# Patient Record
Sex: Male | Born: 1935 | Race: White | Hispanic: No | Smoking: Never smoker
Health system: Southern US, Community
[De-identification: ages and names within clinical notes are randomized; demographics above are authoritative.]

## PROBLEM LIST (undated history)

## (undated) DIAGNOSIS — IMO0001 Reserved for inherently not codable concepts without codable children: Secondary | ICD-10-CM

## (undated) DIAGNOSIS — L03115 Cellulitis of right lower limb: Secondary | ICD-10-CM

## (undated) DIAGNOSIS — E1142 Type 2 diabetes mellitus with diabetic polyneuropathy: Secondary | ICD-10-CM

## (undated) DIAGNOSIS — I2510 Atherosclerotic heart disease of native coronary artery without angina pectoris: Secondary | ICD-10-CM

## (undated) DIAGNOSIS — E785 Hyperlipidemia, unspecified: Secondary | ICD-10-CM

## (undated) DIAGNOSIS — L97519 Non-pressure chronic ulcer of other part of right foot with unspecified severity: Secondary | ICD-10-CM

## (undated) DIAGNOSIS — Z5189 Encounter for other specified aftercare: Secondary | ICD-10-CM

## (undated) DIAGNOSIS — I10 Essential (primary) hypertension: Secondary | ICD-10-CM

## (undated) DIAGNOSIS — Z950 Presence of cardiac pacemaker: Secondary | ICD-10-CM

## (undated) DIAGNOSIS — R296 Repeated falls: Secondary | ICD-10-CM

## (undated) DIAGNOSIS — H5440 Blindness, one eye, unspecified eye: Secondary | ICD-10-CM

## (undated) DIAGNOSIS — E119 Type 2 diabetes mellitus without complications: Secondary | ICD-10-CM

## (undated) DIAGNOSIS — I209 Angina pectoris, unspecified: Secondary | ICD-10-CM

## (undated) HISTORY — PX: CARDIAC CATHETERIZATION: SHX172

## (undated) HISTORY — PX: TOTAL KNEE ARTHROPLASTY: SHX125

## (undated) HISTORY — PX: CATARACT EXTRACTION W/ INTRAOCULAR LENS  IMPLANT, BILATERAL: SHX1307

## (undated) HISTORY — DX: Hyperlipidemia, unspecified: E78.5

## (undated) HISTORY — DX: Atherosclerotic heart disease of native coronary artery without angina pectoris: I25.10

## (undated) HISTORY — PX: TONSILLECTOMY: SUR1361

## (undated) HISTORY — DX: Essential (primary) hypertension: I10

## (undated) HISTORY — PX: EYE SURGERY: SHX253

## (undated) HISTORY — PX: APPENDECTOMY: SHX54

---

## 1990-03-14 DIAGNOSIS — I209 Angina pectoris, unspecified: Secondary | ICD-10-CM

## 1990-03-14 DIAGNOSIS — Z950 Presence of cardiac pacemaker: Secondary | ICD-10-CM

## 1990-03-14 HISTORY — DX: Angina pectoris, unspecified: I20.9

## 1990-03-14 HISTORY — PX: CORONARY ARTERY BYPASS GRAFT: SHX141

## 1990-03-14 HISTORY — DX: Presence of cardiac pacemaker: Z95.0

## 1997-03-14 HISTORY — PX: INSERT / REPLACE / REMOVE PACEMAKER: SUR710

## 1997-07-19 ENCOUNTER — Inpatient Hospital Stay (HOSPITAL_COMMUNITY): Admission: EM | Admit: 1997-07-19 | Discharge: 1997-07-22 | Payer: Self-pay | Admitting: Emergency Medicine

## 1997-10-01 ENCOUNTER — Inpatient Hospital Stay (HOSPITAL_COMMUNITY): Admission: RE | Admit: 1997-10-01 | Discharge: 1997-10-06 | Payer: Self-pay | Admitting: Specialist

## 1997-10-08 ENCOUNTER — Other Ambulatory Visit: Admission: RE | Admit: 1997-10-08 | Discharge: 1997-10-08 | Payer: Self-pay | Admitting: Specialist

## 1997-10-10 ENCOUNTER — Other Ambulatory Visit: Admission: RE | Admit: 1997-10-10 | Discharge: 1997-10-10 | Payer: Self-pay | Admitting: Specialist

## 1999-11-23 ENCOUNTER — Encounter: Admission: RE | Admit: 1999-11-23 | Discharge: 1999-11-23 | Payer: Self-pay | Admitting: Geriatric Medicine

## 1999-11-23 ENCOUNTER — Encounter: Payer: Self-pay | Admitting: Geriatric Medicine

## 1999-11-23 ENCOUNTER — Encounter: Payer: Self-pay | Admitting: Neurological Surgery

## 1999-11-23 ENCOUNTER — Inpatient Hospital Stay (HOSPITAL_COMMUNITY): Admission: RE | Admit: 1999-11-23 | Discharge: 1999-12-01 | Payer: Self-pay | Admitting: Neurological Surgery

## 1999-11-23 ENCOUNTER — Encounter (INDEPENDENT_AMBULATORY_CARE_PROVIDER_SITE_OTHER): Payer: Self-pay | Admitting: Specialist

## 1999-11-25 ENCOUNTER — Encounter: Payer: Self-pay | Admitting: Neurological Surgery

## 1999-11-26 ENCOUNTER — Encounter: Payer: Self-pay | Admitting: Neurological Surgery

## 1999-11-29 ENCOUNTER — Encounter: Payer: Self-pay | Admitting: Neurological Surgery

## 1999-12-01 ENCOUNTER — Inpatient Hospital Stay (HOSPITAL_COMMUNITY)
Admission: RE | Admit: 1999-12-01 | Discharge: 1999-12-17 | Payer: Self-pay | Admitting: Physical Medicine & Rehabilitation

## 1999-12-06 ENCOUNTER — Encounter: Payer: Self-pay | Admitting: Physical Medicine & Rehabilitation

## 1999-12-20 ENCOUNTER — Encounter
Admission: RE | Admit: 1999-12-20 | Discharge: 2000-03-19 | Payer: Self-pay | Admitting: Physical Medicine & Rehabilitation

## 2000-01-11 ENCOUNTER — Encounter: Payer: Self-pay | Admitting: Neurological Surgery

## 2000-01-11 ENCOUNTER — Encounter: Admission: RE | Admit: 2000-01-11 | Discharge: 2000-01-11 | Payer: Self-pay | Admitting: Neurological Surgery

## 2000-09-05 ENCOUNTER — Ambulatory Visit (HOSPITAL_COMMUNITY): Admission: RE | Admit: 2000-09-05 | Discharge: 2000-09-05 | Payer: Self-pay | Admitting: Critical Care Medicine

## 2000-09-05 ENCOUNTER — Encounter: Payer: Self-pay | Admitting: Critical Care Medicine

## 2002-08-15 ENCOUNTER — Emergency Department (HOSPITAL_COMMUNITY): Admission: EM | Admit: 2002-08-15 | Discharge: 2002-08-15 | Payer: Self-pay | Admitting: Emergency Medicine

## 2002-08-15 ENCOUNTER — Encounter: Payer: Self-pay | Admitting: Emergency Medicine

## 2003-09-09 ENCOUNTER — Inpatient Hospital Stay (HOSPITAL_COMMUNITY): Admission: AD | Admit: 2003-09-09 | Discharge: 2003-09-11 | Payer: Self-pay | Admitting: Cardiovascular Disease

## 2003-09-12 ENCOUNTER — Ambulatory Visit (HOSPITAL_COMMUNITY): Admission: RE | Admit: 2003-09-12 | Discharge: 2003-09-13 | Payer: Self-pay | Admitting: Ophthalmology

## 2006-01-03 ENCOUNTER — Encounter: Payer: Self-pay | Admitting: Internal Medicine

## 2006-03-11 ENCOUNTER — Emergency Department (HOSPITAL_COMMUNITY): Admission: EM | Admit: 2006-03-11 | Discharge: 2006-03-11 | Payer: Self-pay | Admitting: Emergency Medicine

## 2006-06-13 ENCOUNTER — Encounter: Payer: Self-pay | Admitting: Internal Medicine

## 2006-12-21 ENCOUNTER — Encounter: Payer: Self-pay | Admitting: Internal Medicine

## 2007-06-25 ENCOUNTER — Encounter: Payer: Self-pay | Admitting: Internal Medicine

## 2007-08-22 ENCOUNTER — Encounter: Admission: RE | Admit: 2007-08-22 | Discharge: 2007-08-22 | Payer: Self-pay | Admitting: Geriatric Medicine

## 2007-12-31 ENCOUNTER — Encounter: Payer: Self-pay | Admitting: Internal Medicine

## 2008-05-29 ENCOUNTER — Encounter: Payer: Self-pay | Admitting: Internal Medicine

## 2008-12-16 ENCOUNTER — Encounter: Payer: Self-pay | Admitting: Internal Medicine

## 2009-03-05 ENCOUNTER — Observation Stay (HOSPITAL_COMMUNITY): Admission: EM | Admit: 2009-03-05 | Discharge: 2009-03-06 | Payer: Self-pay | Admitting: Cardiovascular Disease

## 2009-03-27 ENCOUNTER — Ambulatory Visit: Payer: Self-pay | Admitting: Vascular Surgery

## 2009-05-25 ENCOUNTER — Encounter: Payer: Self-pay | Admitting: Internal Medicine

## 2009-06-30 ENCOUNTER — Encounter (HOSPITAL_BASED_OUTPATIENT_CLINIC_OR_DEPARTMENT_OTHER): Admission: RE | Admit: 2009-06-30 | Discharge: 2009-09-28 | Payer: Self-pay | Admitting: General Surgery

## 2009-07-06 ENCOUNTER — Ambulatory Visit (HOSPITAL_COMMUNITY): Admission: RE | Admit: 2009-07-06 | Discharge: 2009-07-06 | Payer: Self-pay | Admitting: General Surgery

## 2009-07-08 ENCOUNTER — Ambulatory Visit: Payer: Self-pay | Admitting: Cardiovascular Disease

## 2009-07-08 ENCOUNTER — Encounter (HOSPITAL_BASED_OUTPATIENT_CLINIC_OR_DEPARTMENT_OTHER): Payer: Self-pay | Admitting: General Surgery

## 2009-07-08 ENCOUNTER — Ambulatory Visit (HOSPITAL_COMMUNITY): Admission: RE | Admit: 2009-07-08 | Discharge: 2009-07-08 | Payer: Self-pay | Admitting: General Surgery

## 2009-07-08 HISTORY — PX: US ECHOCARDIOGRAPHY: HXRAD669

## 2009-07-16 ENCOUNTER — Ambulatory Visit: Payer: Self-pay | Admitting: Vascular Surgery

## 2009-08-19 ENCOUNTER — Ambulatory Visit (HOSPITAL_COMMUNITY): Admission: RE | Admit: 2009-08-19 | Discharge: 2009-08-19 | Payer: Self-pay | Admitting: General Surgery

## 2009-08-20 ENCOUNTER — Telehealth (INDEPENDENT_AMBULATORY_CARE_PROVIDER_SITE_OTHER): Payer: Self-pay | Admitting: *Deleted

## 2009-08-24 ENCOUNTER — Ambulatory Visit: Payer: Self-pay | Admitting: Cardiovascular Disease

## 2009-08-24 ENCOUNTER — Encounter (HOSPITAL_COMMUNITY): Admission: RE | Admit: 2009-08-24 | Discharge: 2009-10-13 | Payer: Self-pay | Admitting: General Surgery

## 2009-08-24 ENCOUNTER — Ambulatory Visit: Payer: Self-pay

## 2009-09-10 ENCOUNTER — Telehealth (INDEPENDENT_AMBULATORY_CARE_PROVIDER_SITE_OTHER): Payer: Self-pay | Admitting: *Deleted

## 2009-09-29 ENCOUNTER — Encounter (HOSPITAL_BASED_OUTPATIENT_CLINIC_OR_DEPARTMENT_OTHER): Admission: RE | Admit: 2009-09-29 | Discharge: 2009-12-11 | Payer: Self-pay | Admitting: General Surgery

## 2009-10-09 ENCOUNTER — Ambulatory Visit: Payer: Self-pay | Admitting: Vascular Surgery

## 2009-11-06 ENCOUNTER — Ambulatory Visit (HOSPITAL_COMMUNITY): Admission: RE | Admit: 2009-11-06 | Discharge: 2009-11-06 | Payer: Self-pay | Admitting: General Surgery

## 2009-12-17 ENCOUNTER — Encounter (HOSPITAL_BASED_OUTPATIENT_CLINIC_OR_DEPARTMENT_OTHER)
Admission: RE | Admit: 2009-12-17 | Discharge: 2010-02-12 | Payer: Self-pay | Source: Home / Self Care | Admitting: General Surgery

## 2010-02-22 ENCOUNTER — Encounter: Payer: Self-pay | Admitting: Internal Medicine

## 2010-02-22 ENCOUNTER — Ambulatory Visit: Payer: Self-pay | Admitting: Internal Medicine

## 2010-02-22 DIAGNOSIS — N183 Chronic kidney disease, stage 3 (moderate): Secondary | ICD-10-CM

## 2010-02-22 DIAGNOSIS — Z950 Presence of cardiac pacemaker: Secondary | ICD-10-CM | POA: Insufficient documentation

## 2010-02-22 DIAGNOSIS — E1129 Type 2 diabetes mellitus with other diabetic kidney complication: Secondary | ICD-10-CM | POA: Insufficient documentation

## 2010-02-22 DIAGNOSIS — E1159 Type 2 diabetes mellitus with other circulatory complications: Secondary | ICD-10-CM | POA: Insufficient documentation

## 2010-02-22 DIAGNOSIS — I798 Other disorders of arteries, arterioles and capillaries in diseases classified elsewhere: Secondary | ICD-10-CM

## 2010-02-22 DIAGNOSIS — E1165 Type 2 diabetes mellitus with hyperglycemia: Secondary | ICD-10-CM

## 2010-02-22 DIAGNOSIS — E1142 Type 2 diabetes mellitus with diabetic polyneuropathy: Secondary | ICD-10-CM | POA: Insufficient documentation

## 2010-02-22 DIAGNOSIS — I10 Essential (primary) hypertension: Secondary | ICD-10-CM

## 2010-02-22 DIAGNOSIS — E119 Type 2 diabetes mellitus without complications: Secondary | ICD-10-CM | POA: Insufficient documentation

## 2010-02-22 DIAGNOSIS — E785 Hyperlipidemia, unspecified: Secondary | ICD-10-CM | POA: Insufficient documentation

## 2010-02-22 DIAGNOSIS — I2510 Atherosclerotic heart disease of native coronary artery without angina pectoris: Secondary | ICD-10-CM | POA: Insufficient documentation

## 2010-03-05 ENCOUNTER — Encounter (HOSPITAL_BASED_OUTPATIENT_CLINIC_OR_DEPARTMENT_OTHER)
Admission: RE | Admit: 2010-03-05 | Discharge: 2010-03-30 | Payer: Self-pay | Source: Home / Self Care | Attending: General Surgery | Admitting: General Surgery

## 2010-04-04 ENCOUNTER — Encounter (HOSPITAL_BASED_OUTPATIENT_CLINIC_OR_DEPARTMENT_OTHER): Payer: Self-pay | Admitting: General Surgery

## 2010-04-13 NOTE — Progress Notes (Signed)
Summary: MUGA Pre-Procedure  Phone Note Outgoing Call Call back at [facility] Phone [phone]   Call placed by: Stanton Kidney, EMT-P,  August 20, 2009 10:36 AM Call placed to: Patient Action Taken: Phone Call Completed Summary of Call: Reviewed information on MUGA Information Sheet (see scanned document for further details).  Spoke with Patient.    Nuclear Med Background Indications for Stress Test: Evaluation for Ischemia  Indications Comments: Pre-Hyperbaric workup  History: Echo, Pacemaker  History Comments: 07/08/09 Echo: poor quality, mild AR, MR     Nuclear Pre-Procedure Cardiac Risk Factors: Carotid Disease, IDDM Type 2, Lipids

## 2010-04-13 NOTE — Progress Notes (Signed)
  Records Recieved from [facility], gave to [facility]  September 10, 2009 2:35 PM

## 2010-04-15 NOTE — Letter (Signed)
Summary: [facility] Office Visit   [facility] Office Visit   Imported By: Roderic Ovens 03/04/2010 10:56:31  _____________________________________________________________________  External Attachment:    Type:   Image     Comment:   External Document

## 2010-04-15 NOTE — Letter (Signed)
Summary: [facility] Office Visit   [facility] Office Visit   Imported By: Roderic Ovens 03/04/2010 10:55:37  _____________________________________________________________________  External Attachment:    Type:   Image     Comment:   External Document

## 2010-04-15 NOTE — Assessment & Plan Note (Signed)
Summary: nep / previous pt of dr. Aleen Campi. medicare. gd   Visit Type:  np  CC:  shortness of breath and .  History of Present Illness: [name] [name] is referred by Dr. Aleen Campi for ongoing evaluation and treatment of bradycardia s/p PPM, HTN, and CAD.  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  He has done amazingly well.  He still remains active and denies c/p, sob, or peripheral edema. His hearing is decreased.  No other complaints. No weight loss.  Problems Prior to Update: 1)  Peripheral Angiopathy Diseases Classified [place]  (ICD-443.81) 2)  Diab W/periph Circ D/o Type Ii/uns Type Uncntrl  (ICD-250.72) 3)  Diab W/renal Manifests Type Ii/uns Type Uncntrl  (ICD-250.42) 4)  Renal Disease, Chronic, Stage Iii  (ICD-585.3) 5)  Essential Hypertension, Benign  (ICD-401.1) 6)  Diabetes Mellitus, Type II, Controlled  (ICD-250.00) 7)  Peripheral Angiopathy Diseases Classified [place]  (ICD-443.81) 8)  Polyneuropathy in Diabetes  (ICD-357.2)  Current Problems (verified): 1)  Peripheral Angiopathy Diseases Classified [place]  (ICD-443.81) 2)  Diab W/periph Circ D/o Type Ii/uns Type Uncntrl  (ICD-250.72) 3)  Diab W/renal Manifests Type Ii/uns Type Uncntrl  (ICD-250.42) 4)  Renal Disease, Chronic, Stage Iii  (ICD-585.3) 5)  Essential Hypertension, Benign  (ICD-401.1) 6)  Diabetes Mellitus, Type II, Controlled  (ICD-250.00) 7)  Peripheral Angiopathy Diseases Classified [place]  (ICD-443.81) 8)  Polyneuropathy in Diabetes  (ICD-357.2)  Medications Prior to Update: 1)  None  Current Medications (verified): 1)  Xalatan 0.005 % Soln (Latanoprost) .Marland Kitchen.. 1 Drop Each Eye Every Evening 2)  Freestyle Lite Test  Strp (Glucose Blood) .... Twice Daily As Directed 3)  Humulin N 100 Unit/ml Susp (Insulin Isophane Human) .... 65 Units Every Morning 4)  Actos 15 Mg Tabs (Pioglitazone Hcl) .... T Tablet As Needed Elevated Glucose 5)  Prinivil 5 Mg Tabs (Lisinopril) .... Once Daily 6)  Metoprolol  Tartrate 25 Mg Tabs (Metoprolol Tartrate) .... Once Daily 7)  Miralax  Pack (Polyethylene Glycol 3350) .Marland Kitchen.. 1 Packet Mixed With 8 Oz of Fluid Orally Once Daily 8)  Aspirin 325 Mg Tabs (Aspirin) .... Once Daily 9)  Dorzolamide Hcl 2 % Soln (Dorzolamide Hcl) .Marland Kitchen.. 1 Gtt Into Each Eye Two Times A Day 10)  Lipitor 40 Mg Tabs (Atorvastatin Calcium) .... Once Daily  Allergies (verified): 1)  ! Aleve (Naproxen Sodium)  Past History:  Past Medical History: Hypertension  Family History: both parents deceased no alcohol or tobacco  Social History: widowed  Review of Systems       All systems reviewed and negative except as noted in the HPI.  Vital Signs:  Patient profile:   77 year old male Height:      70 inches Weight:      205.50 pounds BMI:     29.59 Pulse rate:   77 / minute BP sitting:   144 / 70  (left arm) Cuff size:   large  Vitals Entered By: Caralee Ates CMA (February 22, 2010 1:59 PM)  Physical Exam  General:  Elderly, well developed, well nourished, in no acute distress.  HEENT: normal Neck: supple. No JVD. Carotids 2+ bilaterally no bruits Cor: RRR no rubs, gallops or murmur Lungs: CTA. Well healed PPM. Ab: soft, nontender. nondistended. No HSM. Good bowel sounds Ext: warm. no cyanosis, clubbing or edema Neuro: alert and oriented. Grossly nonfocal. affect pleasant    PPM Specifications Following MD:  Lewayne Bunting, MD     Referring MD:  Melvenia Beam  PPM Vendor:  [employer]     PPM Model Number:  [id number]     PPM Serial Number:  [id number] PPM DOI:  09/09/2003     PPM Implanting MD:  Melvenia Beam  Lead 1    Location: RA     DOI: 07/21/1997     Model #: [id number]     Serial #: [id number]     Status: active Lead 2    Location: RV     DOI: 07/21/1997     Model #: [id number]     Serial #: [id number]     Status: active  Magnet Response Rate:  BOL 98.6 ERI 86.3  Indications:  Huston Foley   PPM Follow Up Remote Check?  No Battery Voltage:  2.76 V     Battery Est. Longevity:  2.25  years     Pacer Dependent:  No       PPM Device Measurements Atrium  Amplitude: 1.8 mV, Impedance: <200 ohms, Threshold: 1.25 V at 0.5 msec Right Ventricle  Amplitude: 3.0 mV, Impedance: 441 ohms, Threshold: 0.5 V at 0.5 msec  Episodes MS Episodes:  490     Percent Mode Switch:  <1%     Coumadin:  No Atrial Pacing:  88%     Ventricular Pacing:  63%  Parameters Mode:  DDDR     Lower Rate Limit:  75     Upper Rate Limit:  110 Paced AV Delay:  200     Sensed AV Delay:  170 Next Cardiology Appt Due:  08/13/2010 Tech Comments:  Atrial impedance < 200 Uni and bipolar.  R-waves 3.0 bipolar.  RA output increased 2.5@0 .5 bipolar.  Mr. Potts does TTM's but he's not sure with what company.  ROV 6 months clinic. Altha Harm, LPN  February 22, 2010 2:35 PM  MD Comments:  Agree with above.  Impression & Recommendations:  Problem # 1:  CARDIAC PACEMAKER IN SITU (ICD-V45.01) His device is working normally.  Will recheck in several months.  Problem # 2:  CAD (ICD-414.00) He denies anginal symptoms. continue current meds. His updated medication list for this problem includes:    Prinivil 5 Mg Tabs (Lisinopril) ..... Once daily    Metoprolol Tartrate 25 Mg Tabs (Metoprolol tartrate) ..... Once daily    Aspirin 325 Mg Tabs (Aspirin) ..... Once daily  Problem # 3:  DYSLIPIDEMIA (ICD-272.4) He will maintain a low fat diet and lipitor. His updated medication list for this problem includes:    Lipitor 40 Mg Tabs (Atorvastatin calcium) ..... Once daily  Patient Instructions: 1)  Your physician recommends that you continue on your current medications as directed. Please refer to the Current Medication list given to you today. 2)  Your physician wants you to follow-up in: 6 months with [facility] and 1 year with Dr. Ladona Ridgel.   You will receive a reminder letter in the mail two months in advance. If you don't receive a letter, please call our office to schedule the follow-up appointment.  Prevention &  Chronic Care Immunizations   Influenza vaccine: Not documented    Tetanus booster: Not documented    Pneumococcal vaccine: Not documented    H. zoster vaccine: Not documented  Colorectal Screening   Hemoccult: Not documented    Colonoscopy: Not documented  Other Screening   PSA: Not documented   Smoking status: Not documented  Diabetes Mellitus   HgbA1C: Not documented    Eye exam: Not documented    Foot exam: Not documented  High risk foot: Not documented   Foot care education: Not documented    Urine microalbumin/creatinine ratio: Not documented  Lipids   Total Cholesterol: Not documented   LDL: Not documented   LDL Direct: Not documented   HDL: Not documented   Triglycerides: Not documented    SGOT (AST): Not documented   SGPT (ALT): Not documented   Alkaline phosphatase: Not documented   Total bilirubin: Not documented  Hypertension   Last Blood Pressure: 144 / 70  (02/22/2010)   Serum creatinine: Not documented   Serum potassium Not documented  Self-Management Support :    Diabetes self-management support: Not documented    Hypertension self-management support: Not documented    Lipid self-management support: Not documented

## 2010-04-15 NOTE — Letter (Signed)
Summary: [facility] [place], Discharge Summary, Echo, Op Report   [facility] [place], Discharge Summary, Echo, Op Report 2005-2007   Imported By: Roderic Ovens 03/04/2010 10:59:17  _____________________________________________________________________  External Attachment:    Type:   Image     Comment:   External Document

## 2010-04-15 NOTE — Letter (Signed)
Summary: [facility] Office Visit   [facility] Office Visit   Imported By: Roderic Ovens 03/04/2010 10:57:13  _____________________________________________________________________  External Attachment:    Type:   Image     Comment:   External Document

## 2010-04-15 NOTE — Letter (Signed)
Summary: [facility] Office Visit   [facility] Office Visit   Imported By: Roderic Ovens 03/04/2010 10:55:11  _____________________________________________________________________  External Attachment:    Type:   Image     Comment:   External Document

## 2010-04-15 NOTE — Letter (Signed)
Summary: [facility] Office Visit   [facility] Office Visit   Imported By: Roderic Ovens 03/04/2010 10:55:55  _____________________________________________________________________  External Attachment:    Type:   Image     Comment:   External Document

## 2010-04-15 NOTE — Letter (Signed)
Summary: [facility] Office Visit   [facility] Office Visit   Imported By: Roderic Ovens 03/04/2010 10:56:49  _____________________________________________________________________  External Attachment:    Type:   Image     Comment:   External Document

## 2010-04-15 NOTE — Letter (Signed)
Summary: External Correspondence  External Correspondence   Imported By: Roderic Ovens 03/04/2010 10:56:16  _____________________________________________________________________  External Attachment:    Type:   Image     Comment:   External Document

## 2010-05-27 LAB — GLUCOSE, CAPILLARY
Glucose-Capillary: 200 mg/dL — ABNORMAL HIGH (ref 70–99)
Glucose-Capillary: 261 mg/dL — ABNORMAL HIGH (ref 70–99)

## 2010-05-28 LAB — GLUCOSE, CAPILLARY
Glucose-Capillary: 122 mg/dL — ABNORMAL HIGH (ref 70–99)
Glucose-Capillary: 132 mg/dL — ABNORMAL HIGH (ref 70–99)
Glucose-Capillary: 182 mg/dL — ABNORMAL HIGH (ref 70–99)
Glucose-Capillary: 192 mg/dL — ABNORMAL HIGH (ref 70–99)
Glucose-Capillary: 216 mg/dL — ABNORMAL HIGH (ref 70–99)
Glucose-Capillary: 219 mg/dL — ABNORMAL HIGH (ref 70–99)
Glucose-Capillary: 224 mg/dL — ABNORMAL HIGH (ref 70–99)
Glucose-Capillary: 277 mg/dL — ABNORMAL HIGH (ref 70–99)
Glucose-Capillary: 280 mg/dL — ABNORMAL HIGH (ref 70–99)
Glucose-Capillary: 298 mg/dL — ABNORMAL HIGH (ref 70–99)
Glucose-Capillary: 320 mg/dL — ABNORMAL HIGH (ref 70–99)

## 2010-05-29 LAB — GLUCOSE, CAPILLARY
Glucose-Capillary: 140 mg/dL — ABNORMAL HIGH (ref 70–99)
Glucose-Capillary: 144 mg/dL — ABNORMAL HIGH (ref 70–99)
Glucose-Capillary: 164 mg/dL — ABNORMAL HIGH (ref 70–99)
Glucose-Capillary: 193 mg/dL — ABNORMAL HIGH (ref 70–99)
Glucose-Capillary: 226 mg/dL — ABNORMAL HIGH (ref 70–99)
Glucose-Capillary: 229 mg/dL — ABNORMAL HIGH (ref 70–99)
Glucose-Capillary: 231 mg/dL — ABNORMAL HIGH (ref 70–99)
Glucose-Capillary: 252 mg/dL — ABNORMAL HIGH (ref 70–99)
Glucose-Capillary: 273 mg/dL — ABNORMAL HIGH (ref 70–99)
Glucose-Capillary: 279 mg/dL — ABNORMAL HIGH (ref 70–99)
Glucose-Capillary: 316 mg/dL — ABNORMAL HIGH (ref 70–99)
Glucose-Capillary: 333 mg/dL — ABNORMAL HIGH (ref 70–99)
Glucose-Capillary: 338 mg/dL — ABNORMAL HIGH (ref 70–99)
Glucose-Capillary: 351 mg/dL — ABNORMAL HIGH (ref 70–99)
Glucose-Capillary: 371 mg/dL — ABNORMAL HIGH (ref 70–99)

## 2010-05-30 LAB — GLUCOSE, CAPILLARY
Glucose-Capillary: 118 mg/dL — ABNORMAL HIGH (ref 70–99)
Glucose-Capillary: 161 mg/dL — ABNORMAL HIGH (ref 70–99)
Glucose-Capillary: 177 mg/dL — ABNORMAL HIGH (ref 70–99)
Glucose-Capillary: 254 mg/dL — ABNORMAL HIGH (ref 70–99)
Glucose-Capillary: 263 mg/dL — ABNORMAL HIGH (ref 70–99)
Glucose-Capillary: 87 mg/dL (ref 70–99)

## 2010-06-01 LAB — CBC
HCT: 36 % — ABNORMAL LOW (ref 39.0–52.0)
Hemoglobin: 12.6 g/dL — ABNORMAL LOW (ref 13.0–17.0)
MCHC: 34.9 g/dL (ref 30.0–36.0)
MCV: 93.9 fL (ref 78.0–100.0)
Platelets: 193 K/uL (ref 150–400)
RBC: 3.83 MIL/uL — ABNORMAL LOW (ref 4.22–5.81)
RDW: 14.4 % (ref 11.5–15.5)
WBC: 8.4 K/uL (ref 4.0–10.5)

## 2010-06-01 LAB — COMPREHENSIVE METABOLIC PANEL WITH GFR
ALT: 16 U/L (ref 0–53)
AST: 19 U/L (ref 0–37)
Albumin: 3.3 g/dL — ABNORMAL LOW (ref 3.5–5.2)
Alkaline Phosphatase: 124 U/L — ABNORMAL HIGH (ref 39–117)
BUN: 26 mg/dL — ABNORMAL HIGH (ref 6–23)
CO2: 27 meq/L (ref 19–32)
Calcium: 9.1 mg/dL (ref 8.4–10.5)
Chloride: 108 meq/L (ref 96–112)
Creatinine, Ser: 1.17 mg/dL (ref 0.4–1.5)
GFR calc non Af Amer: 59 mL/min — ABNORMAL LOW
Glucose, Bld: 284 mg/dL — ABNORMAL HIGH (ref 70–99)
Potassium: 5.1 meq/L (ref 3.5–5.1)
Sodium: 138 meq/L (ref 135–145)
Total Bilirubin: 0.7 mg/dL (ref 0.3–1.2)
Total Protein: 6.3 g/dL (ref 6.0–8.3)

## 2010-06-01 LAB — DIFFERENTIAL
Basophils Absolute: 0 K/uL (ref 0.0–0.1)
Basophils Relative: 0 % (ref 0–1)
Eosinophils Absolute: 0.2 K/uL (ref 0.0–0.7)
Eosinophils Relative: 3 % (ref 0–5)
Lymphocytes Relative: 20 % (ref 12–46)
Lymphs Abs: 1.7 K/uL (ref 0.7–4.0)
Monocytes Absolute: 1 K/uL (ref 0.1–1.0)
Monocytes Relative: 12 % (ref 3–12)
Neutro Abs: 5.5 K/uL (ref 1.7–7.7)
Neutrophils Relative %: 65 % (ref 43–77)

## 2010-06-01 LAB — SEDIMENTATION RATE: Sed Rate: 18 mm/h — ABNORMAL HIGH (ref 0–16)

## 2010-06-01 LAB — HEMOGLOBIN A1C
Hgb A1c MFr Bld: 8.7 % — ABNORMAL HIGH
Mean Plasma Glucose: 203 mg/dL — ABNORMAL HIGH

## 2010-06-14 LAB — COMPREHENSIVE METABOLIC PANEL
AST: 20 U/L (ref 0–37)
Albumin: 3.8 g/dL (ref 3.5–5.2)
BUN: 18 mg/dL (ref 6–23)
Calcium: 9.3 mg/dL (ref 8.4–10.5)
Chloride: 104 mEq/L (ref 96–112)
Creatinine, Ser: 1.29 mg/dL (ref 0.4–1.5)
GFR calc Af Amer: >60 mL/min (ref >60)
Total Bilirubin: 0.6 mg/dL (ref 0.3–1.2)
Total Protein: 6.9 g/dL (ref 6.0–8.3)

## 2010-06-14 LAB — URINALYSIS, ROUTINE W REFLEX MICROSCOPIC
Hgb urine dipstick: NEGATIVE
Nitrite: NEGATIVE
Protein, ur: NEGATIVE mg/dL
Specific Gravity, Urine: 1.028 (ref 1.005–1.030)
Urobilinogen, UA: 0.2 mg/dL (ref 0.0–1.0)

## 2010-06-14 LAB — CBC
HCT: 40.2 % (ref 39.0–52.0)
MCV: 94 fL (ref 78.0–100.0)
Platelets: 154 10*3/uL (ref 150–400)
RDW: 13.9 % (ref 11.5–15.5)
WBC: 8.8 10*3/uL (ref 4.0–10.5)

## 2010-06-14 LAB — BASIC METABOLIC PANEL
Calcium: 9 mg/dL (ref 8.4–10.5)
Creatinine, Ser: 1.31 mg/dL (ref 0.4–1.5)
GFR calc Af Amer: >60 mL/min (ref >60)
Sodium: 140 mEq/L (ref 135–145)

## 2010-06-14 LAB — CARDIAC PANEL(CRET KIN+CKTOT+MB+TROPI)
CK, MB: 2.5 ng/mL (ref 0.3–4.0)
Relative Index: INVALID (ref 0.0–2.5)
Relative Index: INVALID (ref 0.0–2.5)
Total CK: 59 U/L (ref 7–232)
Troponin I: 0.01 ng/mL (ref 0.00–0.06)

## 2010-06-14 LAB — URINE MICROSCOPIC-ADD ON

## 2010-06-14 LAB — POCT CARDIAC MARKERS
CKMB, poc: 1.6 ng/mL (ref 1.0–8.0)
Troponin i, poc: <0.05 ng/mL (ref 0.00–0.09)

## 2010-06-14 LAB — GLUCOSE, CAPILLARY
Glucose-Capillary: 196 mg/dL — ABNORMAL HIGH (ref 70–99)
Glucose-Capillary: 299 mg/dL — ABNORMAL HIGH (ref 70–99)
Glucose-Capillary: 68 mg/dL — ABNORMAL LOW (ref 70–99)
Glucose-Capillary: 91 mg/dL (ref 70–99)

## 2010-06-14 LAB — TROPONIN I: Troponin I: 0.02 ng/mL (ref 0.00–0.06)

## 2010-06-14 LAB — DIFFERENTIAL
Basophils Absolute: 0 10*3/uL (ref 0.0–0.1)
Eosinophils Relative: 3 % (ref 0–5)
Lymphocytes Relative: 16 % (ref 12–46)
Lymphs Abs: 1.4 10*3/uL (ref 0.7–4.0)
Monocytes Absolute: 0.9 10*3/uL (ref 0.1–1.0)
Monocytes Relative: 10 % (ref 3–12)
Neutro Abs: 6.3 10*3/uL (ref 1.7–7.7)

## 2010-06-14 LAB — CK TOTAL AND CKMB (NOT AT ARMC)
CK, MB: 2.9 ng/mL (ref 0.3–4.0)
Total CK: 70 U/L (ref 7–232)

## 2010-06-14 LAB — BRAIN NATRIURETIC PEPTIDE: Pro B Natriuretic peptide (BNP): 711 pg/mL — ABNORMAL HIGH (ref 0.0–100.0)

## 2010-06-14 LAB — HEMOGLOBIN A1C: Mean Plasma Glucose: 214 mg/dL
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
## 2010-07-27 NOTE — Procedures (Signed)
CAROTID DUPLEX EXAM   INDICATION:  Followup known carotid artery stenosis.   HISTORY:  Diabetes:  Yes.  Cardiac:  CABG, pacemaker.  Hypertension:  No.  Smoking:  No.  Previous Surgery:  No.  CV History:  No.  Amaurosis Fugax No, Paresthesias No, Hemiparesis No                                       RIGHT             LEFT  Brachial systolic pressure:         132               157  Brachial Doppler waveforms:         WNL               WNL  Vertebral direction of flow:        Antegrade         Antegrade  DUPLEX VELOCITIES (cm/sec)  CCA peak systolic                   132               161  ECA peak systolic                   250               150  ICA peak systolic                   205               117  ICA end diastolic                   73                28  PLAQUE MORPHOLOGY:                  Heterogeneous     Heterogeneous  PLAQUE AMOUNT:                      Moderate          Mild  PLAQUE LOCATION:                    ICA, ECA          ICA   IMPRESSION:  1. Right internal carotid artery suggests 60% to 79% stenosis (low end      of range).  2. Left internal carotid artery suggests 1% to 39% stenosis.  3. Antegrade flow in bilateral vertebrals.  4. Stable study in comparison to previous study at outside facility.   ___________________________________________  Larina Earthly, M.D.   CB/MEDQ  D:  10/09/2009  T:  10/09/2009  Job:  [id number]

## 2010-07-27 NOTE — Consult Note (Signed)
NEW PATIENT CONSULTATION   [name] [name], [name] [name]  DOB:  1917-01-01                                       03/27/2009  HYQMV#:[id number]   The patient presents today for evaluation of extraneous cerebrovascular  occlusive disease.  He had been [facility] to [facility]  [facility]/23 and was discharged home on the 12/24.  He had been admitted for a  syncopal episode.  The patient  reports that he has these occasionally  and had been more severe than usual on the day of presentation to the  hospital.  He denies any prior strokes.  Denies any prior focal  neurologic deficits, TIAs, or amaurosis fugax.  Since his discharge from  the hospital, he continues to have his usual generalized weakness.   PAST MEDICAL HISTORY:  Significant for diabetes for many years, insulin-  independent.  Elevated cholesterol.  Does have history of prior  pacemaker for sick sinus syndrome.  Dyslipidemia.  He does have a Do Not  Resuscitate order.   SOCIAL HISTORY:  He is widowed.  He has 1 child.  He does not smoke or  drink alcohol.   REVIEW OF SYSTEMS:  No weight loss or weight gain.  His weight is  reported at 180 pounds.  He is 5 foot 10 inches tall.  CARDIAC:  As above.  PULMONARY:  Negative.  GI:  Constipation.  GU:  Negative.  VASCULAR:  Negative.  NEUROLOGIC:  Positive for dizziness.  MUSCULOSKELETAL:  Positive for arthritis.  PSYCHIATRIC:  Negative.  HEENT:  Positive for change in his eyesight.  HEMATOLOGIC:  No breathing problems.  SKIN:  No rashes.   PHYSICAL EXAM:  Well-developed, well-nourished, white male appearing  stated age of 46.  His blood pressure is 158/81, pulse is 76,  respirations 18.  He is in no acute distress.  HEENT is normal.  Lungs:  Clear bilaterally with no wheezes.  Cardiovascular:  Heart regular rate  and rhythm.  Carotid arteries without bruits bilaterally.  Radial,  femoral, and pedal pulses are 2+ bilaterally.  Abdominal exam reveals no  tenderness or  masses.  Musculoskeletal:  No major deformities or  cyanosis.  Neurologic:  No focal weakness or paresthesias.  Skin:  No  ulcers or rashes.   I reviewed his duplex with him.  This does show severe right carotid  stenosis.  I explained that I do not feel that this is related to the  syncopal episodes that he has had.  I explained that with his degree of  stenosis that we would consider elective endarterectomy.  I am concerned  that he is continuing to have failing health with no clear correctable  cause.  I have asked him to see Dr. Pete Glatter for further discussion.  I  feel that he is at marginal benefit from asymptomatic carotid surgery.  I would appreciate Dr. Laverle Hobby insight on this as well since he has  a long-term relationship with him.  We have tentatively scheduled him  for 20-month followup duplex and I did discuss this with the patient.  If  he has any focal deficits or if Dr. Pete Glatter feels that aggressive  treatment for this asymptomatic disease is appropriate, we would  consider elective carotid endarterectomy.     Larina Earthly, M.D.  Electronically Signed   TFE/MEDQ  D:  03/27/2009  T:  03/30/2009  Job:  [id number]   cc:   Hal T. Stoneking, M.D.

## 2010-07-30 NOTE — Discharge Summary (Signed)
[place]  Patient:    [name] [name], [name] [name]                            MRN: [id number] Adm. Date:  [id number] Disc. Date: [id number] Attending:  Faith Rogue T                           Discharge Summary  ADMITTING DIAGNOSES:  Subacute and chronic subdural hematoma left frontal parietal region.  DISCHARGE/FINAL DIAGNOSES: 1. Subacute and chronic subdural hematoma left frontal parietal region. 2. Right hemiparesis.  CONDITION ON DISCHARGE:  Improving.  HOSPITAL COURSE:  The patient is an 77 year old individual who had a significant subdural hematoma that he presented with to Dr. Lesia Sago office.  Dr. Pete Glatter had ordered a CT scan after the patient was noted to have some clumsiness on the right side.  He was advised to see me, and I admitted him on the day I saw him to the hospital to undergo surgical evacuation of his subdural hematoma.  This was performed on November 23, 1999.  Postoperatively he had a drain in his head for 48 hours.  Gradually after the drain was removed, the subdural hematoma had shrunk significantly in size.  The shift was less, and the patient continued to improve, though he still had some persistent right-sided weakness.  It was felt that he would be a good candidate for inpatient rehabilitation, as he was making slow albeit steady progress in the hospital.  He also was maintained in the intensive care unit for a prolonged period of time as he had hypertension.  He had had bypass surgery and had a pacemaker placed.  He had previous knee replacements, and he was also noted to be diabetic.  He underwent diabetic control with a Glucomander during the hospital stay and gradually was transferred over to oral agents.  The patient, at the time of discharge, is improved.  He still had some residual right hemiparesis.  He is being transferred to the rehabilitation unit.  Sutures have been removed from his surgical incision on his head,  and he is otherwise doing well. DD:  12/23/99 TD:  12/25/99 Job: [id number]

## 2010-07-30 NOTE — Op Note (Signed)
NAMEKUTTER, SCHNEPF                               ACCOUNT NO.:  [id number]   MEDICAL RECORD NO.:  [id number]                   PATIENT TYPE:  OIB   LOCATION:  6523                                 FACILITY:  [facility]   PHYSICIAN:  Richard A. Alanda Amass, M.D.          DATE OF BIRTH:  December 23, 1916   DATE OF PROCEDURE:  09/09/2003  DATE OF DISCHARGE:                                 OPERATIVE REPORT   PROCEDURE:  Explantation of end of life pacesetter, Trilogy DR Plus DDDR  #[id number];SN:[id number].  Implantation of new St. Jude Medical Identity ADXXL-DR,  model #[id number] pulse generator multiprogrammable A-V Universal Rate  Responsive Auto Threshold device.   IMPLANTING PHYSICIAN:  Richard A. Alanda Amass, M.D.   COMPLICATIONS:  None.   ESTIMATED BLOOD LOSS:  Approximately 40 mL.   ANESTHESIA:  5 mg Valium p.o. premedication, 1% Xylocaine, 2 mg Nubain for  sedation during procedure.   PREOPERATIVE DIAGNOSES:  1. Status post DDDR pulse generator, Jul 21, 1997, implanting physician Dr.     Aram Candela. Tysinger, for sick sinus syndrome with sinus arrest and     presyncope with sinus pauses greater than 12 seconds.  2. End of life pulse generator with magnet interval increasing by 100 msec     (MR 70 down to 62.9) with greater than 20 kilohm battery impedance.     Voltage output drop to 2.34 volts.  3. Status post CABG x 6, 1992, Dr. Andrey Campanile.  4. Status post subdural hematoma secondary to fall left parietal lobe,     September 2001.  5. Cataract and glaucoma upcoming surgery OS, Dr. Cecilie Kicks.  6. Insulin-dependent diabetes mellitus.  7. Hyperlipidemia.  8. Remote total right knee replacement.   POSTOPERATIVE DIAGNOSES:  1. Status post DDDR pulse generator, Jul 21, 1997, implanting physician Dr.     Aram Candela. Tysinger, for sick sinus syndrome with sinus arrest and     presyncope with sinus pauses greater than 12 seconds.  2. End of life pulse generator with magnet interval increasing by 100 msec  (MR 70 down to 62.9) with greater than 20 kilohm battery impedance.     Voltage output drop to 2.34 volts.  3. Status post CABG x 6, 1992, Dr. Andrey Campanile.  4. Status post subdural hematoma secondary to fall left parietal lobe,     September 2001.  5. Cataract and glaucoma upcoming surgery OS, Dr. Cecilie Kicks.  6. Insulin-dependent diabetes mellitus.  7. Hyperlipidemia.  8. Remote total right knee replacement.   Retained atrial electrode implanted Jul 21, 1997, screw-in active fixation  atrial #[id number];SN:[id number].   Ventricular electrode passive fixation implanted Jul 21, 1997; #[id number]  [id number].   Both electrodes IS1 connectors bipolar in line coaxial silicone.   Thresholds:  Unipolar P equal 4.1 mV, resistance 249 ohms, threshold 0.6  volts.   Bipolar:  P equal 5.3 mV, resistance 201 ohms, threshold 0.8 V at 0.5 msec.  Unipolar ventricular:  R equal 21 mV, resistance 833 ohms, threshold 0.5 V  at 0.5 ms.   Bipolar ventricular:  R equal 28 mV, resistance 769 ohms, threshold 0.4 V at  0.5 msec.  There was no diaphragmatic stimulation or esophageal stimulation,  unipolar or bipolar on PSA testing at 10 volt output.   There was 1:1 retrograde V-A conduction that was short approximately 220  msec up to a V pacing rate of 100 through the implanted electrodes, and  there was retrograde 2:1 block at a pacing rate of 110 per minute.   DESCRIPTION OF PROCEDURE:  The patient was brought to the second floor CP  lab in a postabsorptive state after 5 mg Valium p.o. premedication.  IV was  going.  The patient did receive his morning insulin and was NPO for almost 8  hours prior to the procedure.  Preoperatively, it was found that the patient  was not pacemaker dependent on PSA testing and end of life characteristics  of his generator were confirmed with excellent ventricular threshold of less  than 0.5 volts and atrial threshold of approximately 1 volt.   A right infraclavicular transverse  incision was performed just below the  previously placed  incision.  This was brought down through the subcutaneous  tissue to the fibrous capsule using blunt dissection and electrocautery to  control hemostasis.  The capsule was incised and opened and using blunt  dissection, the pacemaker was freed up.  A tie-down site was not  identified.  The electrodes were removed from the generator with a single  hex nut opened for each electrode.  Inspection showed good position of the  pin connectors and the visible electrodes encased in fibrous tissue  posteriorly.  There was excellent bipolar, unipolar, atrial, and ventricular  thresholds and no diaphragmatic or esophageal stimulation extend volt output  through the PSA.  There was considerable oozing, probably related to the  patient's continued aspirin use, but this was controlled with electrocautery  and pressure.  The pocket was irrigated with 500 mg of kanamycin solution.  The patient had been premedicated with 1 g of Ancef on course to the  laboratory.  The sponge count was correct.  The electrodes were connected to  the new pulse generator with a single hex nut tightened in the proper AV  sequence, confirmed by serial numbers.  Generator was delivered into the  pocket.  It was loosely secured to the posterior fibrous capsule with a  single #1 silk suture to prevent migration.  The subcutaneous tissue was  closed with two separate running layers of 2-0 Vicryl suture.  The skin was  closed with 5-0 Vicryl subcuticular suture.  Steri-Strips were applied.  The  patient was AV pacing at the lower rate limit of 60 on leaving the  laboratory.  He was transferred to the holding area for a postoperative  programming in stable condition.                                               Richard A. Alanda Amass, M.D.    RAW/MEDQ  D:  09/09/2003  T:  09/09/2003  Job:  [id number]  cc:   Hal T. Stoneking, M.D.  [address]  [place], [place] [address]   Fax: [phone]   Aram Candela. Tysinger, M.D.  [phone]  [place]  [place] [address]  Fax: [phone]   Guadelupe Sabin, M.D.  Fax: [phone]

## 2010-07-30 NOTE — Discharge Summary (Signed)
NAMERADWAN, COWLEY                               ACCOUNT NO.:  [id number]   MEDICAL RECORD NO.:  [id number]                   PATIENT TYPE:  OIB   LOCATION:  5705                                 FACILITY:  [facility]   PHYSICIAN:  Guadelupe Sabin, M.D.             DATE OF BIRTH:  11-17-1916   DATE OF ADMISSION:  09/12/2003  DATE OF DISCHARGE:  09/13/2003                                 DISCHARGE SUMMARY   HISTORY OF PRESENT ILLNESS:  This was a planned outpatient admission of this  77 year old white male admitted with a rhegmatogenous retinal detachment  with proliferative vitreoretinopathy.   The patient had had a recent exchange of his pacemaker batter by Dr. Susa Griffins on September 09, 2003.  He was felt ready for the proposed surgery  which had been postponed due to the pacemaker and due to the death of the  patient's wife.  (See detailed admission history and physical.)   HOSPITAL COURSE:  The patient was evaluated preoperatively and felt to be in  satisfactory condition for the proposed surgery under general anesthesia.  The patient therefore was taken into the operating room where a complicated  scleral buckling procedure was performed on the left eye.  The procedure was  somewhat complicated by the small pupil size, approximately 3 mm, and the  presence of proliferative vitreoretinopathy which tended to make folds in  the peripheral retina.  A scleral buckling procedure was performed using  solid silicone implants #277 and #240 with diathermy application and  drainage of subretinal fluid and intravitreous dexamethasone injection.  The  patient tolerated the 2-hour procedure and was taken from the operating room  to the recovery room  and subsequently to the 23-hour observation unit.  The  patient was monitored for his cardiac status and diabetic status.  The  patient's blood sugar elevated during the night and the patient experienced  acute urinary retention.  A Foley catheter  was inserted overnight and then  removed at approximately 9 a.m. on the day of discharge.  The patient  subsequently voided prior to discharge.  The patient's blood sugar was  controlled with a sliding scale of insulin in addition to his Actos and  70/30 Humulin N insulin.  Examination of the retina on the evening of  surgery and the following morning revealed flattening of the retina at the  posterior pole with only minimal folds on the implant surface.  Applanation  tonometry was slightly elevated at 26 mm in each eye and the patient was  started on his topical glaucoma medications.  The patient was again seen the  following morning with similar findings.  It was felt that he had achieved  maximal hospital benefit and could be discharged home to be followed in the  office in 24 hours.  The patient was told to frequently monitor his blood  sugars and to drink fluids  to continue his urinary outflow.  Discharge  ocular medications include TobraDex and Cyclomydril ophthalmic solutions one  drop four times a day 5 minutes apart.  Maxitrol and atropine ointment at  bedtime.   CONDITION ON DISCHARGE:  Improved.  Prognosis still is guarded due to the  tendency toward peripheral proliferative vitreoretinopathy.  Further surgery  may or may not be necessary.   FOLLOW-UP APPOINTMENT:  My office, 24 hours.   DISCHARGE DIAGNOSES:  1. Rhegmatogenous retinal detachment left eye.  2. Proliferative vitreoretinopathy, left eye.  3. Chronic open angle glaucoma, both eyes.  4. Pseudophakia both eyes with anterior chamber implant.   SECONDARY DIAGNOSES:  1. Cardiac arrhythmia with pacemaker implantation.  2. History of coronary artery bypass surgery.  3. Insulin-dependent diabetes mellitus.                                                Guadelupe Sabin, M.D.    HNJ/MEDQ  D:  09/14/2003  T:  09/15/2003  Job:  [id number]   cc:   Gerlene Burdock A. Alanda Amass, M.D.  [phone] [address], [address]  [place]   [place] [address]  Fax: [phone]   Aram Candela. Aleen Campi, M.D.  [address]  [place]  [place] [address]  Fax: [phone]

## 2010-07-30 NOTE — H&P (Signed)
[name] [name], [name] [name]                               ACCOUNT NO.:  [id number]   MEDICAL RECORD NO.:  [id number]                   PATIENT TYPE:  OIB   LOCATION:  5705                                 FACILITY:  [facility]   PHYSICIAN:  Guadelupe Sabin, M.D.             DATE OF BIRTH:  05-26-1916   DATE OF ADMISSION:  09/12/2003  DATE OF DISCHARGE:  09/13/2003                                HISTORY & PHYSICAL   REASON FOR ADMISSION:  This was a planned outpatient readmission of this 77-  year-old white male admitted with a rhegmatogenous retinal detachment of the  left eye.   PRESENT ILLNESS:  This patient has a complex history of cataract implant  surgery of both eyes - the left eye on October 10, 1995, right eye September 10, 1991.  The left cataract operation was complicated with vitreous loss, and  an anterior vitrectomy was performed.  Anterior chamber intraocular lens  implants were utilized in both eyes.  The patient did well following this  surgery, with return of vision to 20/30 right eye, 20/40 left eye with  correction.  The patient was followed for elevated intraocular pressure of  both eyes, and was treated for glaucoma with Xalatan ophthalmic solutions,  one drop at bedtime to both eyes.  More recently, the patient has had slight  elevation of pressure despite this medication, and Cosopt was added to the  left eye twice a day.  When seen on September 04, 2003, the patient complained of  a black spot or curtain sensation in his left eye in the upper nasal area.  Examination revealed a visual acuity of less than 20/400 in the left eye,  and dilated fundus examination showed an inferior retinal detachment.  Surgery was discussed with the patient.  Unfortunately, the patient's wife  was in hospice in a terminal condition, and surgery was postponed.  The  patient's wife died, and arrangements were again made for his outpatient  surgical readmission.  The patient, however, stated that his  pacemaker  battery was failing, and that it needed replacement.  Anesthesia was  reluctant to place the patient under general anesthesia.  Therefore, the  patient had his pacemaker battery replaced by Dr. Susa Griffins on September 09, 2003.  The patient was followed in the hospital for two days due to some  bleeding around the implant at the time of battery replacement.  The patient  was discharged home on September 11, 2003.  The patient was felt to be ready for  the proposed retinal detachment surgery.  Arrangements were then made for  his admission on September 12, 2003.   PAST MEDICAL HISTORY:  1. See above.  2. Past history of coronary artery bypass surgery in 1992.  3. Subdural hematoma in September of 2001.  4. Insulin-dependent diabetes mellitus controlled with Actos and insulin.  5. Hyperlipidemia.   REVIEW  OF SYSTEMS:  The patient states that his condition is stable.  He  wishes to proceed with the proposed surgery.   PHYSICAL EXAMINATION:  VITAL SIGNS:  As recorded on admission, blood  pressure 153/67, temperature 96.6, heart rate 72, respirations 16.  GENERAL APPEARANCE:  The patient is a pleasant, well-nourished, well-  developed, 77 year old white male in acute ocular distress.  HEENT:  Eyes - visual acuity 20/30 right eye, less than 20/400 left eye.  Applanation tonometry 16 mm each eye on medication.  Slit lamp examination -  the eyes are white and clear, with a clear cornea, deep and clear anterior  chamber, and anterior chamber implant is present in both eyes with  peripheral iridectomies.  Dilated fundus examination shows extension of the  retinal detachment with considerable proliferative vitreal retinopathy with  puckering and folding of the retina.  CHEST:  Lungs clear to percussion and auscultation.  Pacemaker on the right  chest wall.  ABDOMEN:  Negative.  EXTREMITIES:  Negative.   ADMISSION DIAGNOSES:  1. Rhegmatogenous retinal detachment, left eye.  2.  Proliferative vitreal retinopathy, left eye.  3. Pseudophakia, both eyes.  4. Chronic open angle glaucoma, both eyes.                                                Guadelupe Sabin, M.D.    HNJ/MEDQ  D:  09/14/2003  T:  09/15/2003  Job:  [id number]   cc:   Gerlene Burdock A. Alanda Amass, M.D.  [phone] [address]  [place]  [place] [address]  Fax: [phone]   Aram Candela. Aleen Campi, M.D.  [address]  [place]  [place] [address]  Fax: [phone]
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
## 2010-07-30 NOTE — H&P (Signed)
[place]  Patient:    [name] [name], [name] [name]                            MRN: [id number] Adm. Date:  [id number] Attending:  Jonne Ply                         History and Physical  ADMITTING DIAGNOSIS:  Subdural hematoma left parietal lobe.  HISTORY OF PRESENT ILLNESS:  The patient is an 77 year old right-handed individual who is retired. It seems that he had a fall from a tree about three or four weeks ago and he developed some burning sensation in his head that lasted several days. For the past week or so he has been having increasing difficulty with walking. His wife notes that his speech has been deteriorating. He was seen by Dr. Pete Glatter who had a CT scan of his brain performed. The study demonstrates a large left frontoparietal subdural hematoma that appears subacute in nature. There is also some concern that there may be a chronic portion of the subdural hematoma. The patients daughter also notes that the patient has had significant memory problems, perhaps even longer than three weeks.  He was seen in my office this afternoon and he was advised regarding urgent evacuation as the patient had significant shift in mass affect.  He also was noted to be significantly hemiparetic on the right side. The patient denies problems with bowel or bladder control.  PAST MEDICAL HISTORY: 1. Hypertension. 2. Bypass surgery with a pacemaker placed. 3. Knee replacements. 4. Cataract surgery. 5. The patient is diabetic.  ALLERGIES:  Notes no allergies to any medications.  CURRENT MEDICATIONS: 1. Insulin 65 units a day, every morning. 2. Lipitor 40 mg a day. 3. Zantac 150 mg a day. 4. Vitamin E 400 units. 5. Ecotrin one daily. 6. Glucosamine chondroitin as supplement. 7. Actos 30 mg daily.  SOCIAL HISTORY:  He is married and retired. Lives with his wife, but maintains a good level of physical activity.  HABITS:  The patient does not smoke. He  does not drink alcohol. Height and weight have been stable at 5 feet 10 inches, 211 pounds.  REVIEW OF SYSTEMS:  Notable for night sweats, wearing of glasses, cataracts, easy nosebleeds, sinus problems, difficulty with speech, inability to concentrate, anxiety, diabetes and excessive thirst and urination.  PHYSICAL EXAMINATION:  GENERAL:  Alert and cooperative individual in no overt distress. He stands with great difficulty and has difficulty balancing articularly on his right side. His gait is wide-based and unstable. He requires help of an individual to walk comfortably. His station is intact. Rombergs negative.  HEENT:  Pupils are 3 mm, briskly reactive to light and accommodation. Extraocular movements are full. The face is symmetric to grimace. Tongue and uvula are in the midline. Sclerae and conjunctivae are clear. Funduscopic exam reveals fullness in the left fundi to a greater degree than the right fundi.  NECK:  Reveals no masses. There is a bruit on the right carotid.  HEART:  Regular rate and rhythm with no murmurs heard.  LUNGS:  Clear to auscultation.  ABDOMEN:  Soft. Bowel sounds are positive. No masses are palpable.  EXTREMITIES:  Reveal no cyanosis, clubbing or edema. The patient does have evidence of a right cortical drift. His reflexes are 1+ in the biceps, 2+ in the triceps, 2+ in the patellae and 1+ in the Achilles  and both Babinski are upgoing.  IMPRESSION:  The patient has a chronic subdural hematoma that appears subacute in nature. He is now to have a craniotomy evacuation. DD:  11/23/99 TD:  11/24/99 Job: [id number]

## 2010-07-30 NOTE — Op Note (Signed)
[name] [name], KAUTZMAN                               ACCOUNT NO.:  [id number]   MEDICAL RECORD NO.:  [id number]                   PATIENT TYPE:  OIB   LOCATION:  5705                                 FACILITY:  [facility]   PHYSICIAN:  Guadelupe Sabin, M.D.             DATE OF BIRTH:  20-Mar-1916   DATE OF PROCEDURE:  09/12/2003  DATE OF DISCHARGE:  09/13/2003                                 OPERATIVE REPORT   September 14, 2003   PREOPERATIVE DIAGNOSIS:  Rhegmatogenous retinal detachment, left eye;  proliferative vitreoretinopathy, left eye; chronic open angle glaucoma, left  eye.   POSTOPERATIVE DIAGNOSIS:  Rhegmatogenous retinal detachment, left eye;  proliferative vitreoretinopathy, left eye; chronic open angle glaucoma, left  eye.   OPERATION PERFORMED:  Scleral buckling procedure, left eye using solid  silicone implants #277 and 240 with diathermy application and external  drainage of subretinal fluid.   SURGEON:  Guadelupe Sabin, M.D.   ASSISTANT:  Nurse.   ANESTHESIA:  General.   Ophthalmoscopy as previously described.   DESCRIPTION OF PROCEDURE:  After the patient was prepped and draped, lid  traction sutures were placed in the left upper and lower lids.  A lid  speculum was inserted.  A peritomy was performed adjacent to the limbus 360  degrees.  Subconjunctival tissue was cleaned and the rectus muscles were  isolated with 4-0 silk traction sutures.  The sclera was inspected and felt  to be in satisfactory condition for the proposed surgery.  Indirect  ophthalmoscopy was then again performed revealing the marked proliferative  vitreoretinopathy, no definite break could be seen but suspected at the 6  o'clock position.  It was therefore elected to perform a lamellar scleral  dissection from the 12 to 8:30 position, the bed measuring 9 mm in width.  Light diathermy applications were applied to the intrascleral lamella.  A  total of  six 4-0 green M[place]il[place] sutures with 3-0 plain  catgut  reinforcements were used to close the scleral flaps over a trimmed #277  solid silicone implant.  A 240 encircling silicone band was placed in the  groove of the implant and tied with two sutures at the 10 o'clock position.  After repeat indirect ophthalmoscopy, it was elected to drain fluid in the  bed at the 7:30 position.  Incision was made through the inner scleral  lamella, the choroid exposed, treated with light diathermy and then  perforated with the pen electrode.  An abundant amount of clear subretinal  fluid drained.  The scleral flaps were pulled up temporarily and the fundus  inspected, revealing residual subretinal fluid.  It was then elected to  reopen the drain site and additional yellowish clear fluid drained and then  stopped.  The fundus was reinspected revealing no posterior subretinal  fluid.  There still remained a very large star fold but on the implant  surface at the  3 o'clock position and inferior folds on the implant surface.  The posterior retina was flat and attached.  It was then elected to close.  The scleral flaps were pulled up securely and the tension of the encircling  band adjusted.  0.1 mL of dexamethasone was injected through a pars plana  injection site into the vitreous cavity.  This elevated the pressure  slightly and it was necessary to perform a paracentesis in the peripheral  cornea at the 3 o'clock position.  Attention was brought back into a normal  range.  Tenon's capsule was then pulled forward in each of the four  quadrants and tied as a separate layer.  Half strength Neosporin ophthalmic  solution was irrigated in the subtenon's spaces during the latter portion of  the operation.  The conjunctiva was then pulled forward and closed with a  running 6-0 chromic catgut suture.  Depo-Garamycin and dexamethasone were  injected in the subtenon's space inferiorly.  Maxitrol and atropine ointment  were instilled in the conjunctival cul-de-sac.   Duration of the procedure  two hours.  The patient tolerated the procedure well in general and left the  operating room for the recovery room, in good condition.                                               Guadelupe Sabin, M.D.    HNJ/MEDQ  D:  09/14/2003  T:  09/15/2003  Job:  [id number]

## 2010-07-30 NOTE — Op Note (Signed)
[place]  Patient:    [name] [name], [name] [name]                            MRN: [id number] Proc. Date: 11/23/99 Adm. Date:  [id number] Attending:  Jonne Ply                           Operative Report  PREOPERATIVE DIAGNOSIS:  Left subdural hematoma, subacute.  POSTOPERATIVE DIAGNOSIS:  Left subdural hematoma, subacute.  OPERATION: 1. Left parietal craniotomy. 2. Evacuation of subdural hematoma. 3. Resection of membranes.  SURGEON:  Stefani Dama, M.D.  ANESTHESIA:  General endotracheal.  INDICATIONS:  The patient is an 77 year old individual who has had progressive difficulty with walking and speech difficulty.  He was found to have a hemispheric subdural hematoma on the left side.  He was taken to the operating room.  DESCRIPTION OF PROCEDURE:  The patient was brought to the operating room, placed on the table in the supine position.  After smooth induction of general endotracheal anesthesia, the patients head was shaved, turned to the right side and the left parietal area was prepped with Duraprep and draped in a sterile fashion.  A vertical incision was made from the anterior border of the ear to the vertex of the skull.  Self retaining retractors were placed in the wound and the galea was lifted and then suffered off of the bone.  Periosteum was scrapped from the bone.  A bur hole was created in the base of the opening at the junction of the parietal bone and the temporal squame.  Craniotomy flap was then raised within the confines of the incision and this was laid aside. The underlying dura was noted to be tense, markedly discolored.  It was incised vertically and chronic blood was drained.  When the area was explored after opening the dura in a cruciate fashion there was a combination of subacute blood, chronic membranes and some very thin fluid in the craniotomy defect.  The area was then evacuated fully relieving the parietal  membrane in a circular fashion within the confines of the craniotomy.  The visceral membrane was then elevated after opening a small area and irrigating underneath of the brain surface.  This was resected and an area similar to what the parietal membrane was evacuated from.  With these two membranes being removed, underlying surface of the brain was noted to be free and clear.  The brain did not rise significantly after being decompressed.  The area was checked for hemostasis and then the membrane edges were cauterized in their perimetry.  When no bleeding was noted, the dura was closed loosely with a ventriculostomy catheter being left in the subdural space and brought out through a separate stab incision. This was then connected to a grenade suction of Jackson-Pratt type.  The bone was reapproximated and held in place with Osteomed plates.  The scalp was then closed with 2-0 Vicryl interrupted fashion and 3-0 Vicryl and surgical staples were used in the skin.  Nylon was used to secure the drains.  The patients scalp was addressed with Betadine ointment and then with a head wrap.  The patient was returned to recovery room in stable condition. DD:  11/23/99 TD:  11/25/99 Job: [id number]

## 2010-07-30 NOTE — Discharge Summary (Signed)
[name] [name] [name]. [name] [name] [name]  Patient:    [name] [name], [name]                            MRN: [id number] Adm. Date:  [id number] Disc. Date: 12/20/99 Attending:  Faith Rogue [name] Dictator:   [name] Rossetti. Angiulli, P.A. CC:         [name] [name] [name], M.D.  [name] [name] [name], M.D.  [name] [name] [name], M.D.   Discharge Summary  DISCHARGE DIAGNOSES: 1. Left frontal parietal craniotomy for subdural hematoma on November 23, 1999. 2. Hypertension. 3. Diabetes mellitus. 4. Coronary artery disease with coronary artery bypass grafting and pacemaker. 5. Hyperlipidemia.  HISTORY OF PRESENT ILLNESS:  An 77 year old white male admitted on November 23, 1999, with history of fall from a tree 3 to 4 weeks ago and progressive decrease in ambulation and slurred speech.  On evaluation a cranial CT scan showed a large left frontal parietal subdural hematoma that appeared subacute. He underwent left parietal craniotomy and evacuation of hematoma on November 23, 1999, by Dr. Danielle [name].  Placed on Dilantin for seizure prophylaxis.  Later discontinued on December 01, 1999.  Followup CT scan on November 29, 1999, stable.  Waist belt was used for safety.  Confusion continued to improve. Blood sugars variable on insulin, it was adjusted.  Minimal to moderate assist for ambulation.  Latest chemistries unremarkable.  Admitted for a comprehensive rehab program.  PAST MEDICAL HISTORY:  See discharge diagnoses.  PAST SURGICAL HISTORY: 1. Cataract surgery. 2. Right knee surgery.  ALLERGIES:  No known drug allergies.  HABITS:  No alcohol or tobacco.  PRIMARY CARE PHYSICIAN:  [name] [name] [name], M.D.  CARDIOLOGIST:  [name] [name] [name], M.D.  MEDICATIONS PRIOR TO ADMISSION: 1. Insulin 65 units q.a.m. 2. Lipitor 40 mg daily. 3. Zantac 150 mg daily. 4. Vitamin E 400 units daily. 5. Aspirin daily. 6. Glucosamine 750 mg daily. 7. Actos 30 mg daily.  SOCIAL HISTORY:  Lives with wife, retired.   Independent prior to admission. One level home with eight steps to entry.  Wife can assist on discharge except limited on her lifting.  Family also in the area.  [name] [name] COURSE:  The patient did well while in rehabilitation services with therapies initiated on a b.i.d. basis.  The following issues were followed during the patients rehab course:  Pertaining to [name] [name] [name] left frontal parietal craniotomy remained stable. Surgical site healing nicely.  No signs of infection.  Strength grossly graded at 4-/5.  His mental status continued to improve.  There was no unsafe behavior.  His waist belt was discontinued.  He had no headache or dizziness reported.  His blood pressures remained controlled without the use of antihypertensive medications.  Blood sugars remained elevated with variables pushing 290, 310.  His insulin was adjusted and split.  His Actos had been increased back to 30 mg daily as prior to [name] [name] admission.  He was beginning to show normalization of his blood sugars.  It was stressed to wife the need for ongoing diet control as well as medications.  He would follow up with Dr. Pete [name] [name].  He had a history of coronary artery disease with pacemaker.  His aspirin had been discontinued due to his subdural hematoma. It would be cardiologys consult on resuming this medication.  He was treated for an urinary tract infection during his rehabilitation stay with amoxicillin.  E. coli was the organism.  Overall for his functional  mobility he was ambulating 120 feet with minimal assistance and a rolling walker.  Minimum to moderate assistance for activities of daily living.  Overall, his strength and endurance greatly improved.  His mental status continued to progress nicely.  He would receive home health therapies as well as a rolling walker and wheelchair.  All family teaching was completed.  Latest labs showed a sodium of 137, potassium 4.8, BUN 23, creatinine 1.1, hemoglobin  11.6, hematocrit 33.2.  DISCHARGE MEDICATIONS: 1. Protonix 40 mg daily. 2. Multivitamin daily. 3. Claritin 10 mg daily. 4. Zonegran 100 mg at bedtime. 5. Insulin Humulin N 20 units q.p.m. and 68 units q.a.m. 6. Actos 30 mg daily. 7. Tylenol as needed.  ACTIVITY:  As tolerated.  DIET:  A 2000 calorie ADA.  SPECIAL INSTRUCTIONS: 1. No driving. 2. Check blood sugars twice daily.  Call Dr. Pete [name] [name] if blood sugars less    than 60 or greater than 250. 3. Home health therapies. 4. Follow up with Dr. Danielle [name] in neurosurgery.DD:  12/15/99 TD:  12/15/99 Job: [id number]

## 2010-10-18 ENCOUNTER — Ambulatory Visit (INDEPENDENT_AMBULATORY_CARE_PROVIDER_SITE_OTHER): Payer: Medicare Other | Admitting: *Deleted

## 2010-10-18 DIAGNOSIS — I498 Other specified cardiac arrhythmias: Secondary | ICD-10-CM

## 2010-10-18 NOTE — Progress Notes (Signed)
Pacer check

## 2010-12-05 IMAGING — CR DG FOOT COMPLETE 3+V*R*
3 series · 3 of 3 positions shown · non-contrast
Comparison: Right os calcis 08/19/2009

CLINICAL DATA: Diabetes, right foot pain, unable to bear weight,
question infection

RIGHT FOOT COMPLETE - 3+ VIEW

[t foot ap right]
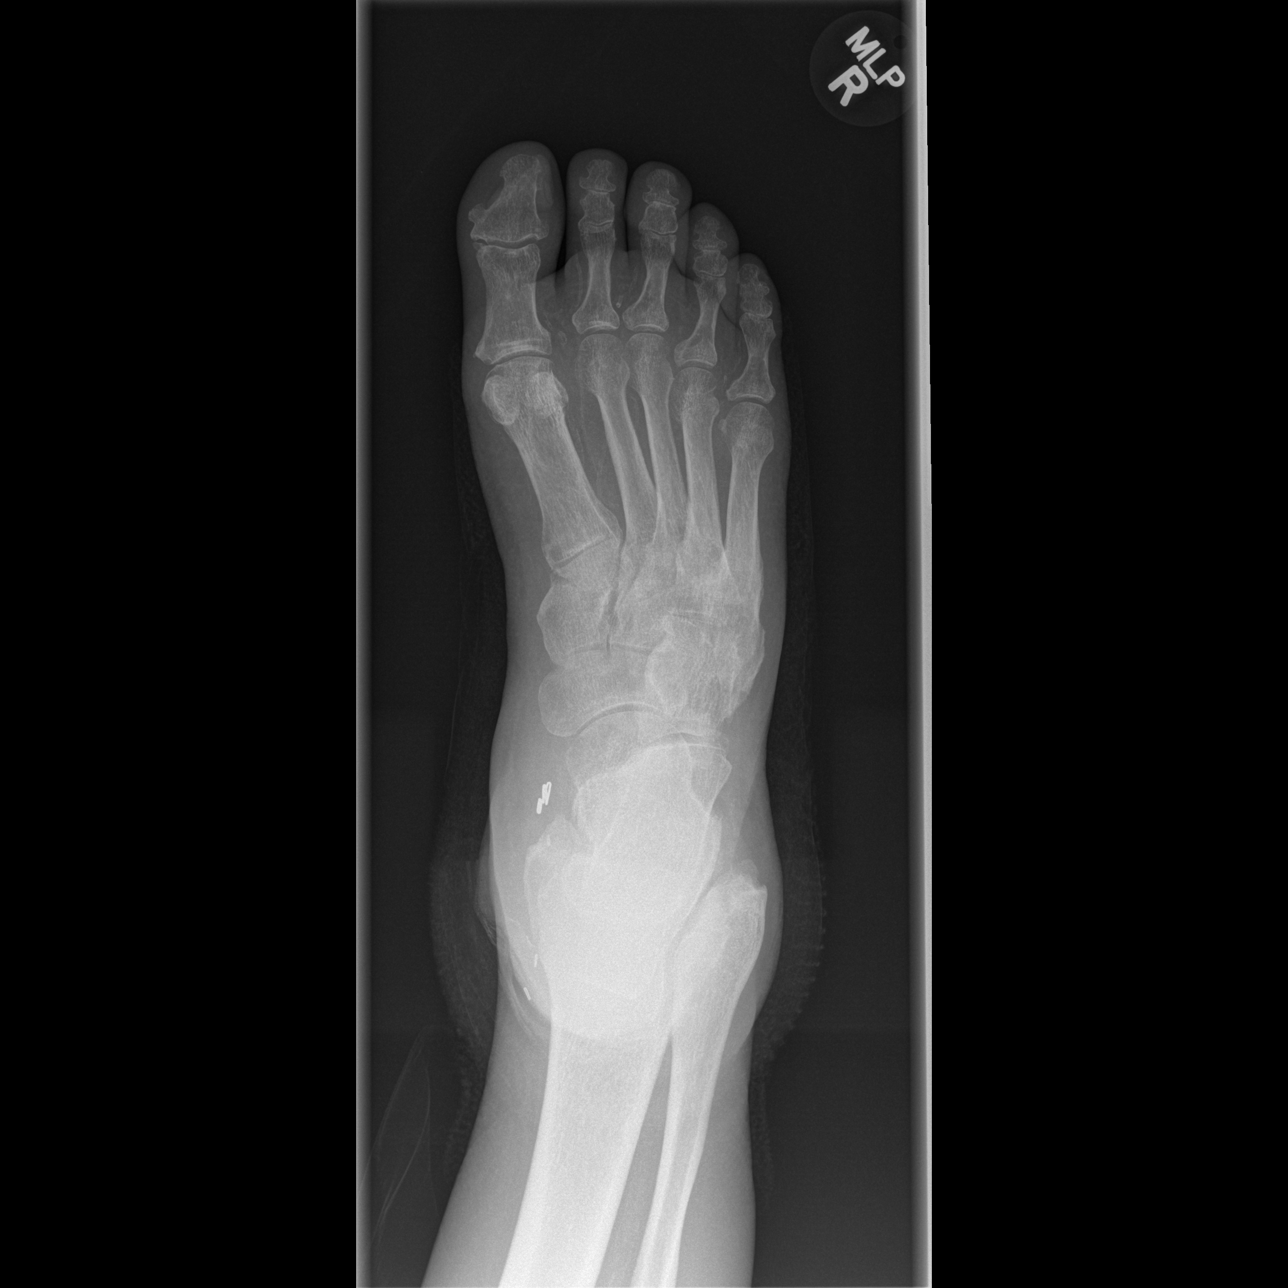

[t foot oblique right]
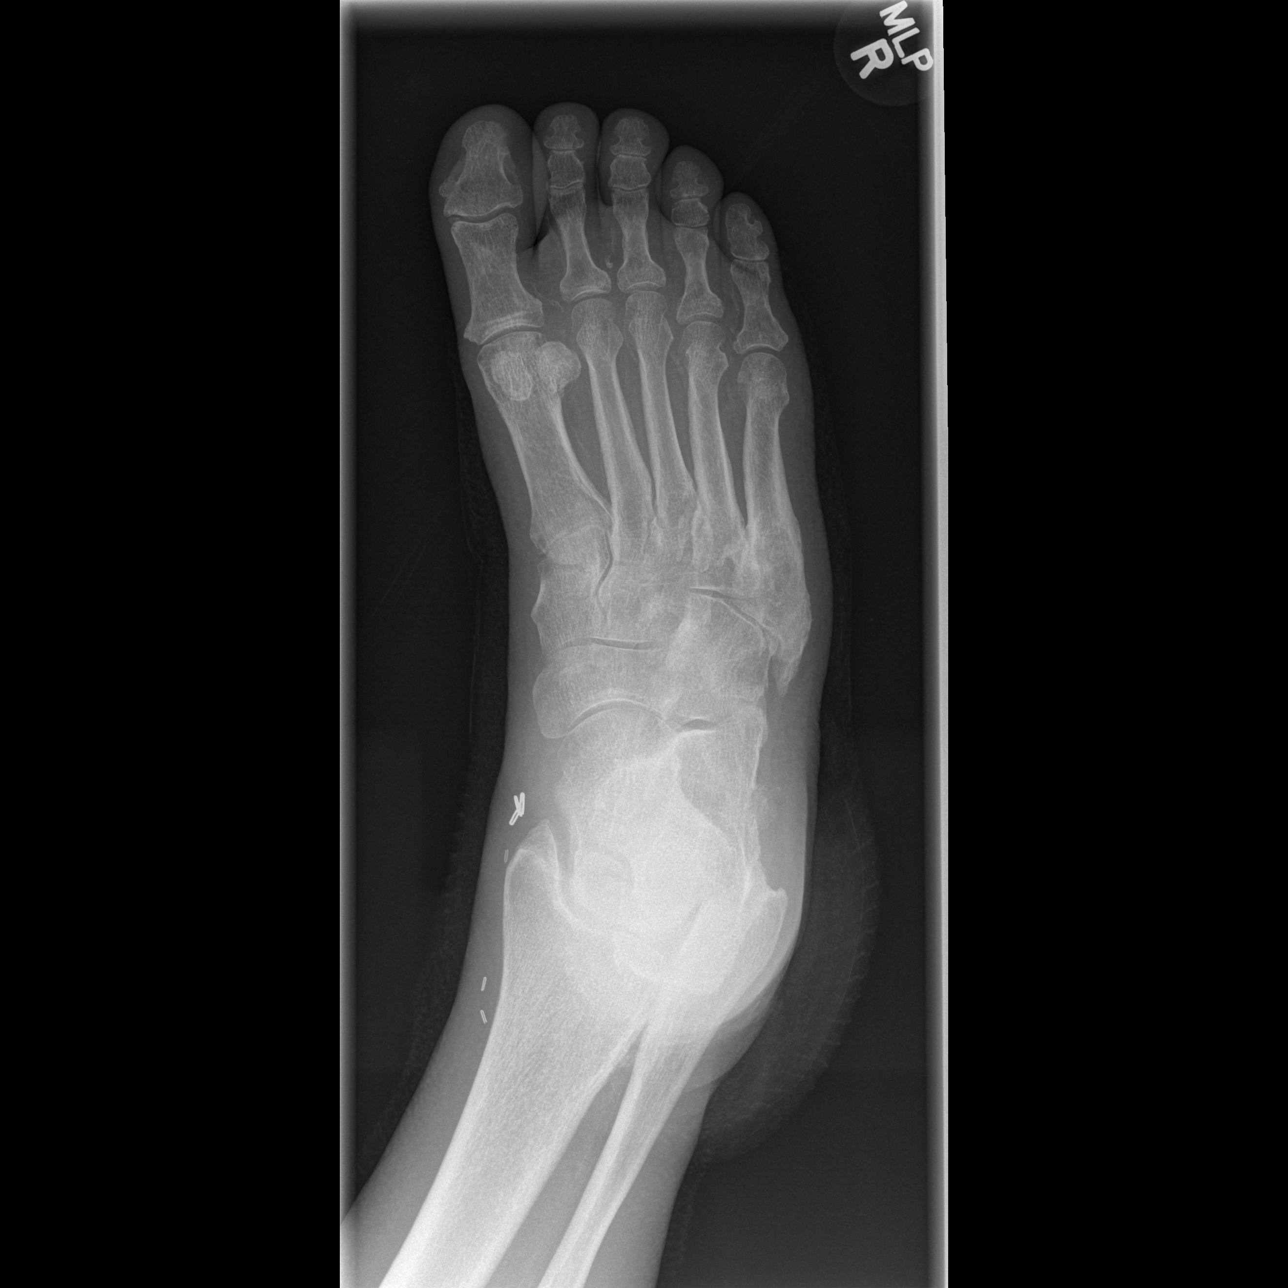

[t foot lat right]
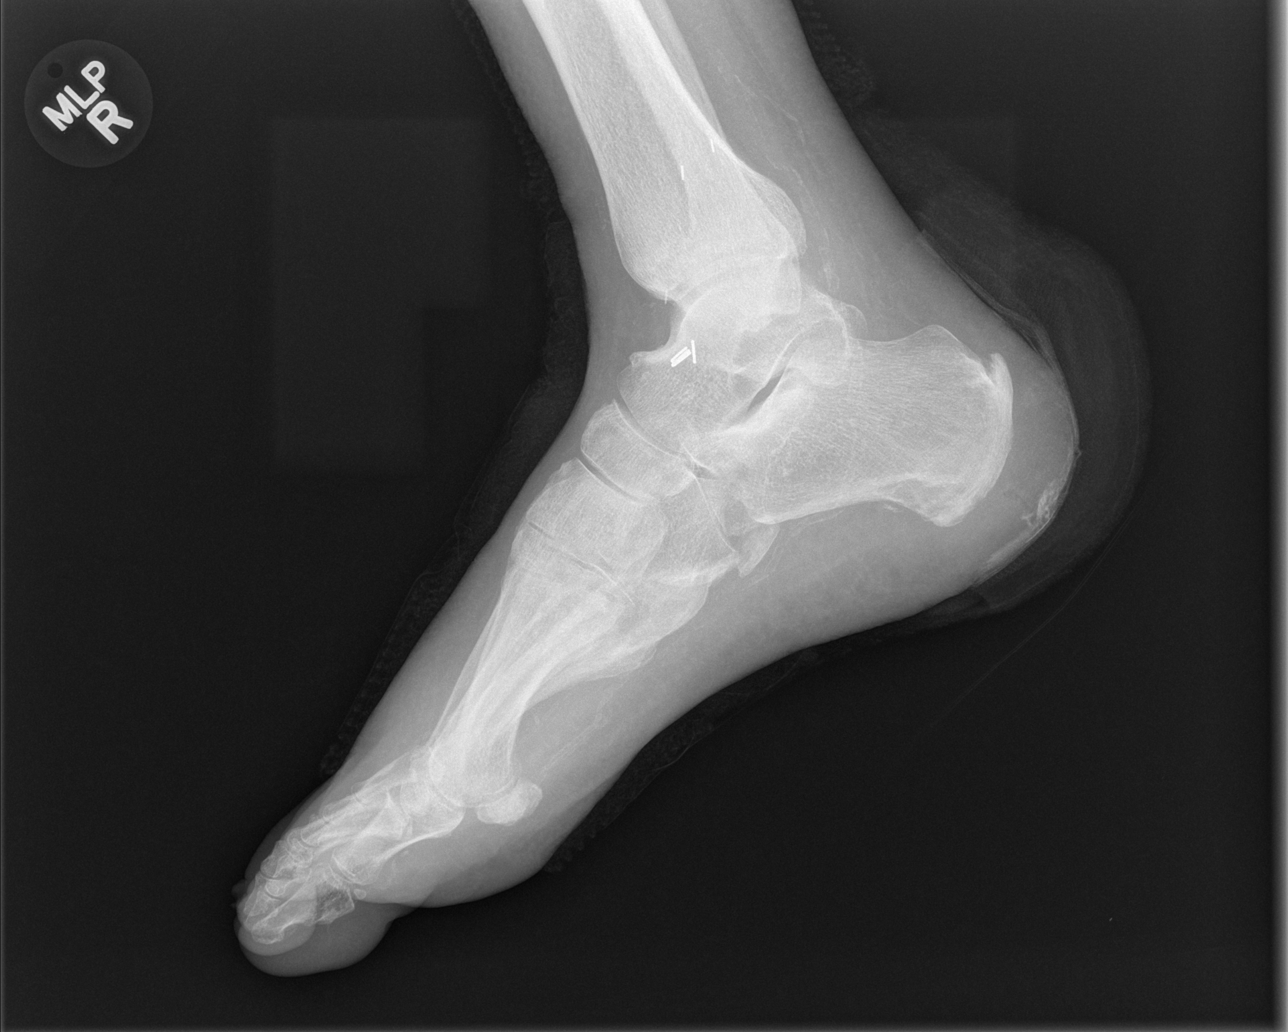

[3 of 3 positions shown; findings below may reference images not displayed]

FINDINGS: Bony demineralization.
Small vessel vascular calcifications consistent with diabetes.
Joint spaces preserved.
No acute fracture or dislocation.
Surgical clips at medial aspect of the right foot/ankle.
Spur at base of fifth metatarsal.
No osseous destruction seen to suggest osteomyelitis.
Spur formation noted at Achilles insertion of posterior calcaneus.
Accessory ossification center at lateral margin of calcaneocuboid
joint.
Question mild soft tissue swelling at dorsum of distal foot.
IMPRESSION: Demineralization.
No radiographic evidence of osteomyelitis.

## 2011-01-18 ENCOUNTER — Ambulatory Visit (INDEPENDENT_AMBULATORY_CARE_PROVIDER_SITE_OTHER): Payer: Medicare Other | Admitting: Internal Medicine

## 2011-01-18 ENCOUNTER — Encounter: Payer: Self-pay | Admitting: Internal Medicine

## 2011-01-18 DIAGNOSIS — I2510 Atherosclerotic heart disease of native coronary artery without angina pectoris: Secondary | ICD-10-CM

## 2011-01-18 DIAGNOSIS — I10 Essential (primary) hypertension: Secondary | ICD-10-CM

## 2011-01-18 DIAGNOSIS — I498 Other specified cardiac arrhythmias: Secondary | ICD-10-CM

## 2011-01-18 DIAGNOSIS — Z950 Presence of cardiac pacemaker: Secondary | ICD-10-CM

## 2011-01-18 LAB — PACEMAKER DEVICE OBSERVATION
BAMS-0001: 170 {beats}/min
BAMS-0003: 70 {beats}/min
DEVICE MODEL PM: 1358160
RV LEAD AMPLITUDE: 4.6 mv
VENTRICULAR PACING PM: 90

## 2011-01-18 NOTE — Assessment & Plan Note (Signed)
His device is working well. Today we reprogrammed his atrial outputs to try and increase his battery longevity. Will check him in 6 months in the office and I will see him back in a year.

## 2011-01-18 NOTE — Progress Notes (Signed)
HPI [name] returns today for followup. He is a 77 year old man with a history of symptomatic bradycardia status post pacemaker insertion, dyslipidemia, and hypertension. The patient continues to do well despite his advanced age. He denies chest pain, shortness of breath, or peripheral edema. No syncope. He uses a walker to ambulate. Allergies  Allergen Reactions  . Naproxen Sodium      Current Outpatient Prescriptions  Medication Sig Dispense Refill  . aspirin 325 MG tablet Take 325 mg by mouth daily.        Marland Kitchen atorvastatin (LIPITOR) 40 MG tablet Take 40 mg by mouth daily.        . dorzolamide-timolol (COSOPT) 22.3-6.8 MG/ML ophthalmic solution 1 drop 2 (two) times daily. In to affected eye       . glucose blood test strip 1 each by Other route as needed. Use as instructed       . glucose monitoring kit (FREESTYLE) monitoring kit 1 each by Does not apply route. Test twice daily as directed       . insulin NPH (HUMULIN N,NOVOLIN N) 100 UNIT/ML injection Inject 65 Units into the skin daily before breakfast.        . latanoprost (XALATAN) 0.005 % ophthalmic solution Place 1 drop into both eyes at bedtime.        . metoprolol succinate (TOPROL-XL) 25 MG 24 hr tablet Take 25 mg by mouth daily.        . pioglitazone (ACTOS) 15 MG tablet Take 15 mg by mouth as needed. For elevated glucose       . polyethylene glycol (MIRALAX / GLYCOLAX) packet Take 17 g by mouth daily.           Past Medical History  Diagnosis Date  . HTN (hypertension)   . Atherosclerotic heart disease   . Diabetes mellitus   . Dyslipidemia   . Glaucoma   . Neuropathy     post secondary to diabetes    ROS:   All systems reviewed and negative except as noted in the HPI.   Past Surgical History  Procedure Date  . US echocardiography 07/08/2009  . Status post cabg 1992     No family history on file.   History   Social History  . Marital Status: Widowed    Spouse Name: N/A    Number of Children: N/A  .  Years of Education: N/A   Occupational History  . Not on file.   Social History Main Topics  . Smoking status: Never Smoker   . Smokeless tobacco: Not on file  . Alcohol Use: No     denies  . Drug Use: No     denies  . Sexually Active: Not on file   Other Topics Concern  . Not on file   Social History Narrative  . No narrative on file     BP 122/66  Pulse 71  Wt 206 lb (93.441 kg)  Physical Exam:  Well appearing elderly man, NAD HEENT: Unremarkable Neck:  No JVD, no thyromegally Lungs:  Clear with no wheezes, rales, or rhonchi. Well-healed pacemaker incision. HEART:  Regular rate rhythm, no murmurs, no rubs, no clicks Abd:  soft, positive bowel sounds, no organomegally, no rebound, no guarding Ext:  2 plus pulses, no edema, no cyanosis, no clubbing Skin:  No rashes no nodules Neuro:  CN II through XII intact, motor grossly intact  DEVICE  Normal device function.  See PaceArt for details.   Assess/Plan:

## 2011-01-18 NOTE — Patient Instructions (Signed)
Your physician wants you to follow-up in: 6 months with device clinic and 12 months with Dr Taylor You will receive a reminder letter in the mail two months in advance. If you don't receive a letter, please call our office to schedule the follow-up appointment.  

## 2011-01-18 NOTE — Assessment & Plan Note (Signed)
He is fairly sedentary but he denies anginal symptoms. He will continue his current medical therapy.

## 2011-01-18 NOTE — Assessment & Plan Note (Signed)
His blood pressure is well controlled. He'll continue a low sodium diet his current medical therapy.

## 2011-03-17 ENCOUNTER — Encounter: Payer: Self-pay | Admitting: Internal Medicine

## 2011-07-25 ENCOUNTER — Ambulatory Visit (INDEPENDENT_AMBULATORY_CARE_PROVIDER_SITE_OTHER): Payer: Medicare Other | Admitting: *Deleted

## 2011-07-25 ENCOUNTER — Encounter: Payer: Self-pay | Admitting: Internal Medicine

## 2011-07-25 DIAGNOSIS — I498 Other specified cardiac arrhythmias: Secondary | ICD-10-CM

## 2011-07-25 LAB — PACEMAKER DEVICE OBSERVATION
AL AMPLITUDE: 1.8 mv
AL IMPEDENCE PM: <200 Ohm
AL THRESHOLD: 1.25 V
BAMS-0003: 70 {beats}/min
BATTERY VOLTAGE: 2.71 V
RV LEAD AMPLITUDE: 3 mv

## 2011-07-25 NOTE — Progress Notes (Signed)
PPM check 

## 2011-11-24 ENCOUNTER — Encounter: Payer: Self-pay | Admitting: Vascular Surgery

## 2011-11-30 ENCOUNTER — Encounter (HOSPITAL_COMMUNITY): Payer: Self-pay | Admitting: *Deleted

## 2011-11-30 ENCOUNTER — Ambulatory Visit: Payer: Medicare Other

## 2011-11-30 ENCOUNTER — Other Ambulatory Visit: Payer: Self-pay | Admitting: Emergency Medicine

## 2011-11-30 ENCOUNTER — Emergency Department (HOSPITAL_COMMUNITY): Payer: Medicare Other

## 2011-11-30 ENCOUNTER — Ambulatory Visit (INDEPENDENT_AMBULATORY_CARE_PROVIDER_SITE_OTHER): Payer: Medicare Other | Admitting: Emergency Medicine

## 2011-11-30 ENCOUNTER — Emergency Department (HOSPITAL_COMMUNITY)
Admission: EM | Admit: 2011-11-30 | Discharge: 2011-11-30 | Disposition: A | Discharge status: Home / Self Care | Payer: Medicare Other | Attending: Emergency Medicine | Admitting: Emergency Medicine

## 2011-11-30 VITALS — BP 116/44 | HR 74 | Temp 98.7°F | Resp 20

## 2011-11-30 DIAGNOSIS — Z888 Allergy status to other drugs, medicaments and biological substances status: Secondary | ICD-10-CM | POA: Insufficient documentation

## 2011-11-30 DIAGNOSIS — X58XXXA Exposure to other specified factors, initial encounter: Secondary | ICD-10-CM | POA: Insufficient documentation

## 2011-11-30 DIAGNOSIS — E119 Type 2 diabetes mellitus without complications: Secondary | ICD-10-CM

## 2011-11-30 DIAGNOSIS — R739 Hyperglycemia, unspecified: Secondary | ICD-10-CM

## 2011-11-30 DIAGNOSIS — S91309A Unspecified open wound, unspecified foot, initial encounter: Secondary | ICD-10-CM

## 2011-11-30 DIAGNOSIS — L97519 Non-pressure chronic ulcer of other part of right foot with unspecified severity: Secondary | ICD-10-CM

## 2011-11-30 DIAGNOSIS — I10 Essential (primary) hypertension: Secondary | ICD-10-CM | POA: Insufficient documentation

## 2011-11-30 DIAGNOSIS — E875 Hyperkalemia: Secondary | ICD-10-CM | POA: Insufficient documentation

## 2011-11-30 DIAGNOSIS — Z23 Encounter for immunization: Secondary | ICD-10-CM

## 2011-11-30 DIAGNOSIS — Z7982 Long term (current) use of aspirin: Secondary | ICD-10-CM | POA: Insufficient documentation

## 2011-11-30 DIAGNOSIS — L97509 Non-pressure chronic ulcer of other part of unspecified foot with unspecified severity: Secondary | ICD-10-CM

## 2011-11-30 DIAGNOSIS — Z794 Long term (current) use of insulin: Secondary | ICD-10-CM | POA: Insufficient documentation

## 2011-11-30 LAB — BASIC METABOLIC PANEL
BUN: 37 mg/dL — ABNORMAL HIGH (ref 6–23)
GFR calc Af Amer: 50 mL/min — ABNORMAL LOW (ref >90)
GFR calc non Af Amer: 43 mL/min — ABNORMAL LOW (ref >90)
Potassium: 5.4 mEq/L — ABNORMAL HIGH (ref 3.5–5.1)
Sodium: 128 mEq/L — ABNORMAL LOW (ref 135–145)

## 2011-11-30 LAB — CBC
HCT: 28.8 % — ABNORMAL LOW (ref 39.0–52.0)
Hemoglobin: 9.6 g/dL — ABNORMAL LOW (ref 13.0–17.0)
MCHC: 33.3 g/dL (ref 30.0–36.0)
RBC: 3.26 MIL/uL — ABNORMAL LOW (ref 4.22–5.81)
WBC: 10.7 10*3/uL — ABNORMAL HIGH (ref 4.0–10.5)

## 2011-11-30 LAB — URINE MICROSCOPIC-ADD ON

## 2011-11-30 LAB — POCT CBC
Granulocyte percent: 81.5 %G — AB (ref 37–80)
MID (cbc): 0.7 (ref 0–0.9)
MPV: 8.4 fL (ref 0–99.8)
POC Granulocyte: 9.9 — AB (ref 2–6.9)
POC MID %: 5.5 %M (ref 0–12)
Platelet Count, POC: 131 10*3/uL — AB (ref 142–424)
RBC: 3.57 M/uL — AB (ref 4.69–6.13)

## 2011-11-30 LAB — DIFFERENTIAL
Lymphocytes Relative: 19 % (ref 12–46)
Lymphs Abs: 2 10*3/uL (ref 0.7–4.0)
Monocytes Absolute: 1.3 10*3/uL — ABNORMAL HIGH (ref 0.1–1.0)
Monocytes Relative: 12 % (ref 3–12)
Neutro Abs: 7.3 10*3/uL (ref 1.7–7.7)

## 2011-11-30 LAB — URINALYSIS, ROUTINE W REFLEX MICROSCOPIC
Bilirubin Urine: NEGATIVE
Nitrite: NEGATIVE
Specific Gravity, Urine: 1.02 (ref 1.005–1.030)
Urobilinogen, UA: 0.2 mg/dL (ref 0.0–1.0)

## 2011-11-30 LAB — GLUCOSE, CAPILLARY: Glucose-Capillary: 351 mg/dL — ABNORMAL HIGH (ref 70–99)

## 2011-11-30 MED ORDER — SODIUM CHLORIDE 0.9 % IV SOLN
INTRAVENOUS | Status: DC
  Administered 2011-11-30: 16:00:00 via INTRAVENOUS

## 2011-11-30 MED ORDER — SODIUM CHLORIDE 0.9 % IV BOLUS (SEPSIS)
1000.0000 mL | Freq: Once | INTRAVENOUS | Status: AC
  Administered 2011-11-30: 1000 mL via INTRAVENOUS

## 2011-11-30 NOTE — ED Provider Notes (Signed)
History     CSN: [id number]  Arrival date & time 11/30/11  1254   First MD Initiated Contact with Patient 11/30/11 1423      Chief Complaint  Patient presents with  . Wound Check  . Hyperglycemia    (Consider location/radiation/quality/duration/timing/severity/associated sxs/prior treatment) HPI Comments: [name] [name] is a 78 y.o. Male who is here for evaluation of a recurrent right heel wound. He has been having drainage from the wound; after it swelled, and he pulled some "dead skin off." This occurred 2 days ago. No known trauma. Similar wound, one year ago, was treated by wound care management team, with dressing changes and hyperbaric oxygen. He had resolution of the wound. He tried to see one management yesterday, but was unable to without an appointment. He went to an urgent care today. Had an x-ray of the right os calcis that was read as negative for osteomyelitis. His sugar, last night at home, was 177. He typically likes his sugar, to be around 200 at bedtime. Otherwise, he tends to drop low at night. This morning he had 2 bowls of Wheaties and 2 large orange juices. He feels that caused his sugar, to be high when it was checked at the urgent care Center. No recent nausea, vomiting, fever, chills, cough, shortness of breath, chest pain, back pain, weakness, or dizziness. No known aggravating or palliative factors.  Patient is a 78 y.o. male presenting with wound check. The history is provided by the patient and a friend.  Wound Check     Past Medical History  Diagnosis Date  . HTN (hypertension)   . Atherosclerotic heart disease   . Diabetes mellitus   . Dyslipidemia   . Glaucoma   . Neuropathy     post secondary to diabetes    Past Surgical History  Procedure Date  . US echocardiography 07/08/2009  . Status post cabg 1992    History reviewed. No pertinent family history.  History  Substance Use Topics  . Smoking status: Never Smoker   . Smokeless tobacco: Never  Used  . Alcohol Use: No     denies      Review of Systems  All other systems reviewed and are negative.    Allergies  Naproxen sodium  Home Medications   Current Outpatient Rx  Name Route Sig Dispense Refill  . ASPIRIN 325 MG PO TABS Oral Take 325 mg by mouth daily.      . ATORVASTATIN CALCIUM 40 MG PO TABS Oral Take 40 mg by mouth daily.      Marland Kitchen BISACODYL 5 MG PO TBEC Oral Take 5 mg by mouth daily as needed. constipation    . DORZOLAMIDE HCL-TIMOLOL MAL 22.3-6.8 MG/ML OP SOLN  1 drop 2 (two) times daily. In to affected eye     . INSULIN ISOPHANE HUMAN 100 UNIT/ML [place] SUSP Subcutaneous Inject 65 Units into the skin daily before breakfast.     . LATANOPROST 0.005 % OP SOLN Both Eyes Place 1 drop into both eyes at bedtime.      Marland Kitchen METOPROLOL TARTRATE 25 MG PO TABS Oral Take 25 mg by mouth daily.    Marland Kitchen PIOGLITAZONE HCL 15 MG PO TABS Oral Take 15 mg by mouth as needed. For elevated glucose       BP 127/66  Pulse 69  Temp 98.2 F (36.8 C) (Oral)  Resp 16  SpO2 99%  Physical Exam  Nursing note and vitals reviewed. Constitutional: He is oriented to person, place,  and time. He appears well-developed and well-nourished.  HENT:  Head: Normocephalic and atraumatic.  Right Ear: External ear normal.  Left Ear: External ear normal.  Eyes: Conjunctivae normal and EOM are normal. Pupils are equal, round, and reactive to light.  Neck: Normal range of motion and phonation normal. Neck supple.  Cardiovascular: Normal rate, regular rhythm, normal heart sounds and intact distal pulses.   Pulmonary/Chest: Effort normal and breath sounds normal. He exhibits no bony tenderness.  Abdominal: Soft. Normal appearance. There is no tenderness.  Musculoskeletal: Normal range of motion.       Right heel has a superficial injury, approximately 4 x 5 cm consisting of denuded skin with normal appearing subcutaneous tissue. There is a mild amount of erythema around this, that is not confluent, and is  without fluctuance. No proximal streaking.  Neurological: He is alert and oriented to person, place, and time. He has normal strength. No cranial nerve deficit or sensory deficit. He exhibits normal muscle tone. Coordination normal.  Skin: Skin is warm, dry and intact.  Psychiatric: He has a normal mood and affect. His behavior is normal. Judgment and thought content normal.    ED Course  Procedures (including critical care time)   Wound care. Nursing applied a saline, wet-to-dry dressing to the right heel wound.   I discussed the case with his PCP, he is aware of his chronic glucose level, variability. He'll followup with the patient to arrange for wound care.       Labs Reviewed  GLUCOSE, CAPILLARY - Abnormal; Notable for the following:    Glucose-Capillary 351 (*)     All other components within normal limits  BASIC METABOLIC PANEL - Abnormal; Notable for the following:    Sodium 128 (*)     Potassium 5.4 (*)     CO2 18 (*)     Glucose, Bld 239 (*)     BUN 37 (*)     Creatinine, Ser 1.36 (*)     GFR calc non Af Amer 43 (*)     GFR calc Af Amer 50 (*)     All other components within normal limits  URINALYSIS, ROUTINE W REFLEX MICROSCOPIC - Abnormal; Notable for the following:    Glucose, UA >1000 (*)     All other components within normal limits  CBC - Abnormal; Notable for the following:    WBC 10.7 (*)     RBC 3.26 (*)     Hemoglobin 9.6 (*)     HCT 28.8 (*)     All other components within normal limits  DIFFERENTIAL - Abnormal; Notable for the following:    Monocytes Absolute 1.3 (*)     All other components within normal limits  URINE CULTURE  URINE MICROSCOPIC-ADD ON  LAB REPORT - SCANNED   No results found.   1. Open wound of foot   2. Hyperglycemia   3. Hyperkalemia       MDM  Recurrent foot wound, possible pressure related. No evidence for severe infection. Mile Hyperkalemia, likely related to ACE medication. Doubt metabolic instability, serious  bacterial infection or impending vascular collapse; the patient is stable for discharge.    Plan: Home Medications- usual; Home Treatments- wound care instructions given; Recommended follow up- PCP in 2-4 days       Flint Melter, MD 12/03/11 1422

## 2011-11-30 NOTE — ED Notes (Signed)
IV team paged.  

## 2011-11-30 NOTE — Progress Notes (Signed)
  Subjective:    Patient ID: [name] [name], male    DOB: 10-Nov-1916, 78 y.o.   MRN: [id number]  HPI patient with a complicated history. He is a history of a polyneuropathy peripheral arterial disease and diabetes mellitus with a previous history of a nonhealing ulcer of his right foot. It took months for his previous ulcer disease he only went to multiple centers before eventually clearing his ulcer at the [facility] [place] [facility] wound center. He tried to make an appointment but was told he could not be seen for 3 weeks and so presents here for evaluation.    Review of Systems     Objective:   Physical Exam physical exam reveals a 3 x 4" area of skin loss over the medial portion of the heel. There is serous oozing from this area. There is mild redness distally. He has poor filling to the toes with approximately 5 second fill time. The dorsalis pedis and posterior tibial pulses are not palpable.  [facility] reading (PRIMARY) by  Dr.Lysette Lindenbaum please comment on heel as to whether there are any signs of osteomyelitis   Results for orders placed in visit on 11/30/11  POCT CBC      Component Value Range   WBC 12.2 (*) 4.6 - 10.2 K/uL   Lymph, poc 1.6  0.6 - 3.4   POC LYMPH PERCENT 13.0  10 - 50 %L   MID (cbc) 0.7  0 - 0.9   POC MID % 5.5  0 - 12 %M   POC Granulocyte 9.9 (*) 2 - 6.9   Granulocyte percent 81.5 (*) 37 - 80 %G   RBC 3.57 (*) 4.69 - 6.13 M/uL   Hemoglobin 10.2 (*) 14.1 - 18.1 g/dL   HCT, POC 40.9 (*) 81.1 - 53.7 %   MCV 92.7  80 - 97 fL   MCH, POC 28.6  27 - 31.2 pg   MCHC 30.8 (*) 31.8 - 35.4 g/dL   RDW, POC 91.4     Platelet Count, POC 131 (*) 142 - 424 K/uL   MPV 8.4  0 - 99.8 fL  GLUCOSE, POCT (MANUAL RESULT ENTRY)      Component Value Range   POC Glucose HHH over 444  70 - 99 mg/dl       Assessment & Plan:     Patient here with a large nonhealing ulcer over his left heel. Patient has underlying peripheral arterial disease as well as neuropathy and diabetes.Patient will be sent to the ER at  [facility] for eval and rx.. Sugar in our office greater then 444.

## 2011-11-30 NOTE — ED Notes (Signed)
Sent from Drs office. Has wound on heel and had CBG checked states was 400. Pt denies complaints.

## 2011-11-30 NOTE — ED Notes (Signed)
Pt was taught how to do dressing change, pt did not understand. Will teach son when he arrives to pick up pt.

## 2011-12-02 ENCOUNTER — Encounter (HOSPITAL_BASED_OUTPATIENT_CLINIC_OR_DEPARTMENT_OTHER): Payer: Medicare Other | Attending: General Surgery

## 2011-12-02 DIAGNOSIS — Z951 Presence of aortocoronary bypass graft: Secondary | ICD-10-CM | POA: Insufficient documentation

## 2011-12-02 DIAGNOSIS — E119 Type 2 diabetes mellitus without complications: Secondary | ICD-10-CM | POA: Insufficient documentation

## 2011-12-02 DIAGNOSIS — I739 Peripheral vascular disease, unspecified: Secondary | ICD-10-CM | POA: Insufficient documentation

## 2011-12-02 DIAGNOSIS — L89609 Pressure ulcer of unspecified heel, unspecified stage: Secondary | ICD-10-CM | POA: Insufficient documentation

## 2011-12-02 DIAGNOSIS — Z79899 Other long term (current) drug therapy: Secondary | ICD-10-CM | POA: Insufficient documentation

## 2011-12-02 DIAGNOSIS — Z950 Presence of cardiac pacemaker: Secondary | ICD-10-CM | POA: Insufficient documentation

## 2011-12-02 DIAGNOSIS — L8990 Pressure ulcer of unspecified site, unspecified stage: Secondary | ICD-10-CM | POA: Insufficient documentation

## 2011-12-02 DIAGNOSIS — Z7982 Long term (current) use of aspirin: Secondary | ICD-10-CM | POA: Insufficient documentation

## 2011-12-02 LAB — URINE CULTURE

## 2011-12-03 LAB — WOUND CULTURE: Gram Stain: NONE SEEN

## 2011-12-05 NOTE — Progress Notes (Signed)
[facility]  NAME:  [name] [name], [name] [name]                        ACCOUNT NO.:  MEDICAL RECORD NO.:  [id number]      DATE OF BIRTH:  [birth year]  PHYSICIAN:  Ardath Sax, M.D.           VISIT DATE:                                  OFFICE VISIT   This gentleman has been a patient in our clinic couple of years ago.  He is a 78 year old World War II veteran, and is very alert.  He lives at home.  He was sent here by his doctor with a recurrence of a pressure sore on his right heel.  He was seen by his doctor and found to have a normal CBC and chest x-ray.  Today, he was afebrile.  His pulse is 70, respirations 16, blood pressure 114/78.  He has on his right heel pressure sore that is certainly from I guess the position in bed.  He still has dermis intact and there is no infection whatsoever and what I elected to do was treat him with some collagen and we will have him come back.  I do not feel any pulses.  He has a history of peripheral vascular disease and type 2 diabetes, which is well taken care of.  He has had a CABG in the past and he has a pacemaker and he had cataract surgery, but the only medicine he takes is Lipitor and aspirin.  I am going to treat him with the collagen and we also will offload him on a Podus boot, and I will see him in a week.     Ardath Sax, M.D.     PP/MEDQ  D:  12/02/2011  T:  12/03/2011  Job:  [id number]

## 2011-12-08 ENCOUNTER — Encounter (HOSPITAL_COMMUNITY): Payer: Self-pay | Admitting: Emergency Medicine

## 2011-12-08 ENCOUNTER — Emergency Department (HOSPITAL_COMMUNITY)
Admission: EM | Admit: 2011-12-08 | Discharge: 2011-12-08 | Disposition: A | Discharge status: Home / Self Care | Payer: Medicare Other | Attending: Emergency Medicine | Admitting: Emergency Medicine

## 2011-12-08 DIAGNOSIS — E1142 Type 2 diabetes mellitus with diabetic polyneuropathy: Secondary | ICD-10-CM | POA: Insufficient documentation

## 2011-12-08 DIAGNOSIS — I10 Essential (primary) hypertension: Secondary | ICD-10-CM | POA: Insufficient documentation

## 2011-12-08 DIAGNOSIS — E1169 Type 2 diabetes mellitus with other specified complication: Secondary | ICD-10-CM | POA: Insufficient documentation

## 2011-12-08 DIAGNOSIS — L97409 Non-pressure chronic ulcer of unspecified heel and midfoot with unspecified severity: Secondary | ICD-10-CM | POA: Insufficient documentation

## 2011-12-08 DIAGNOSIS — Z7982 Long term (current) use of aspirin: Secondary | ICD-10-CM | POA: Insufficient documentation

## 2011-12-08 DIAGNOSIS — E11621 Type 2 diabetes mellitus with foot ulcer: Secondary | ICD-10-CM

## 2011-12-08 DIAGNOSIS — E1149 Type 2 diabetes mellitus with other diabetic neurological complication: Secondary | ICD-10-CM | POA: Insufficient documentation

## 2011-12-08 DIAGNOSIS — I2510 Atherosclerotic heart disease of native coronary artery without angina pectoris: Secondary | ICD-10-CM | POA: Insufficient documentation

## 2011-12-08 DIAGNOSIS — Z886 Allergy status to analgesic agent status: Secondary | ICD-10-CM | POA: Insufficient documentation

## 2011-12-08 DIAGNOSIS — E785 Hyperlipidemia, unspecified: Secondary | ICD-10-CM | POA: Insufficient documentation

## 2011-12-08 DIAGNOSIS — Z79899 Other long term (current) drug therapy: Secondary | ICD-10-CM | POA: Insufficient documentation

## 2011-12-08 DIAGNOSIS — H409 Unspecified glaucoma: Secondary | ICD-10-CM | POA: Insufficient documentation

## 2011-12-08 DIAGNOSIS — M79604 Pain in right leg: Secondary | ICD-10-CM

## 2011-12-08 DIAGNOSIS — Z794 Long term (current) use of insulin: Secondary | ICD-10-CM | POA: Insufficient documentation

## 2011-12-08 HISTORY — DX: Reserved for inherently not codable concepts without codable children: IMO0001

## 2011-12-08 HISTORY — DX: Encounter for other specified aftercare: Z51.89

## 2011-12-08 LAB — GLUCOSE, CAPILLARY: Glucose-Capillary: 258 mg/dL — ABNORMAL HIGH (ref 70–99)

## 2011-12-08 MED ORDER — SILVER SULFADIAZINE 1 % EX CREA
TOPICAL_CREAM | CUTANEOUS | Status: AC
  Administered 2011-12-08: 1
  Filled 2011-12-08: qty 50

## 2011-12-08 NOTE — Progress Notes (Signed)
Left message for marie, assistant for Dr stone king, at [phone], informing her of pt ed visit for pain/mobility issues, initiation of new home health [facility] orders, d/c disposition home and need for dr Pete Glatter to follow up with [facility] for hhpt, aide and sw orders and further orders Cm left her contact number for assistant for further questions

## 2011-12-08 NOTE — ED Notes (Signed)
ZOX:[id number]<AV> Expected date:12/08/11<BR> Expected time:10:07 AM<BR> Means of arrival:Ambulance<BR> Comments:<BR> 89yoF M, R foot pain

## 2011-12-08 NOTE — ED Notes (Signed)
Per EMS pt showered this morning, walked to bedroom and could not walk anymore due to pain from diabetic ulcer on R heel that radiated up his R leg where dressing came off. Pt has hx of DM.

## 2011-12-08 NOTE — Progress Notes (Signed)
No return call from Dr stone king office at this time Informed pt of message left for marie, pcp assistant. Pt d/c home Prior to d/c pt questioned by CM again about having private duty nursing stay with him.  Pt refused to have anyone in his home His daughter per Shon Hale is in [place] and unable to care for pt.  Provided pt with list of private duty nursing services and contact information for [facility] and dr Pete Glatter.  Shon Hale has information sheet Shon Hale stated he may go by to see Dr stone king Spoke with cheryl at [employer]

## 2011-12-08 NOTE — ED Provider Notes (Signed)
History     CSN: [id number]  Arrival date & time 12/08/11  1022   First MD Initiated Contact with Patient 12/08/11 1023      Chief Complaint  Patient presents with  . Skin Ulcer  . Foot Pain    The history is provided by the patient.   the patient is 78 years old he is a diabetic he presents the emergency department today reporting or severe pain than usual in his right leg.  He reports his pain was in his right hip and radiated down to his right thigh.  He has no back pain.  He has no bowel or bladder complaints.  He reports his pain at this time is improved.  He's been evaluated recently at the wound Center for chronic diabetic right foot heel ulcer which he reports was last worked on a week ago.  He has appointment at the wound care center tomorrow.  His friend reports that he lives at home alone and he has more difficulty ambulating with his walker lately due to pain in his right leg.  He has refused home health in the past.  He has no fevers or chills.  He has no other complaints.  Denies abdominal pain.  He reports his pain his right leg is improved at this time.  Past Medical History  Diagnosis Date  . HTN (hypertension)   . Atherosclerotic heart disease   . Diabetes mellitus   . Dyslipidemia   . Glaucoma   . Neuropathy     post secondary to diabetes  . Blood transfusion     Past Surgical History  Procedure Date  . US echocardiography 07/08/2009  . Status post cabg 1992  . Bypass graft     6 bypass surgeries    History reviewed. No pertinent family history.  History  Substance Use Topics  . Smoking status: Never Smoker   . Smokeless tobacco: Never Used  . Alcohol Use: No     denies      Review of Systems  All other systems reviewed and are negative.    Allergies  Naproxen sodium  Home Medications   Current Outpatient Rx  Name Route Sig Dispense Refill  . ASPIRIN 325 MG PO TABS Oral Take 325 mg by mouth daily.      . ATORVASTATIN CALCIUM 40 MG PO  TABS Oral Take 40 mg by mouth daily.      Marland Kitchen BISACODYL 5 MG PO TBEC Oral Take 5 mg by mouth daily as needed. constipation    . DORZOLAMIDE HCL-TIMOLOL MAL 22.3-6.8 MG/ML OP SOLN Both Eyes Place 1 drop into both eyes 2 (two) times daily. In to affected eye    . INSULIN ISOPHANE HUMAN 100 UNIT/ML  SUSP Subcutaneous Inject 65 Units into the skin daily before breakfast.     . LATANOPROST 0.005 % OP SOLN Both Eyes Place 1 drop into both eyes at bedtime.      Marland Kitchen METOPROLOL TARTRATE 25 MG PO TABS Oral Take 25 mg by mouth daily.    Marland Kitchen PIOGLITAZONE HCL 15 MG PO TABS Oral Take 15 mg by mouth as needed. For elevated glucose       BP 153/60  Pulse 83  Temp 97.7 F (36.5 C) (Oral)  Resp 18  SpO2 96%  Physical Exam  Nursing note and vitals reviewed. Constitutional: He is oriented to person, place, and time. He appears well-developed and well-nourished.  HENT:  Head: Normocephalic and atraumatic.  Eyes: EOM are normal.  Neck: Normal range of motion.  Cardiovascular: Normal rate, regular rhythm, normal heart sounds and intact distal pulses.   Pulmonary/Chest: Effort normal and breath sounds normal. No respiratory distress.  Abdominal: Soft. He exhibits no distension. There is no tenderness.  Musculoskeletal: Normal range of motion.       Normal pulses in right PT and DP arteries by Doppler.  Chronic appearing right heel ulcer without secondary signs of infection.  The surrounding skin and wound itself is pink and appears viable.  He has good perfusion distally.  Full range of motion of right knee and right hip.  No swelling of right lower extremity as compared to left.  No ecchymosis or rash noted.   Neurological: He is alert and oriented to person, place, and time.  Skin: Skin is warm and dry.  Psychiatric: He has a normal mood and affect. Judgment normal.    ED Course  Procedures (including critical care time)  Labs Reviewed  GLUCOSE, CAPILLARY - Abnormal; Notable for the following:     Glucose-Capillary 258 (*)     All other components within normal limits   No results found.   1. Right leg pain   2. Chronic diabetic ulcer of right foot determined by examination       MDM  The patient has normal pulses in his right foot.  His right heel ulcer appears chronic without any signs of infection at this time.  Although symptoms sound like right-sided sciatica.  The patient was able to ambulate at the bedside.  Case management was involved to help arrange home health and home PT and social worker to come to his house to assist this 78 year old gentleman.  He will not need to go to his wound care center appointment tomorrow as we will do his dressing change here.  No indication for additional workup in emergency department.        Lyanne Co, MD 12/08/11 [phone]

## 2011-12-08 NOTE — Progress Notes (Signed)
Spoke with david at wound care center [id number] to cancel 12/09/11 appointment for pt

## 2011-12-08 NOTE — Progress Notes (Signed)
Fax x 2 to [facility] with return confirmation reports x 2

## 2011-12-08 NOTE — Progress Notes (Signed)
[facility] ED CM consulted by EDP, Campos to assist with d/c planning for home health services.  CM reviewed EPIC, spoke with pt and friend, [name] [name] Pt wanting admission to hospital Cm allow pt to ventilate his feelings. CM reviewed process of home health,private duty nursing, and their differences. Pt reports he had been seen by "[facility]" then he started going to [facility] on [address] Pt is scheduled to go to wound care center on Friday 12/09/11 with assist of [name] [name].  CM and EDP spoke with pt to review that pt is not scheduled for admission but d/c home and pt agreed to home health services (HHPT, SW and aide if possible per insurance coverage) if ED staff would dress his wound so that he does not have to got to the wound center on 12/09/11 Explained to pt also that he can be transported home via ambulance if he chooses.  Spoke to Clydie Braun at wound care center (ext [phone]) to obtain wound care treatment (clean with ns then dress with zinc oxide and silvadene, covered by kerlix and net) Orders written for wound prior to [facility] ED  Pt is followed by Dr Ardath Sax at the wound care center and pt is already scheduled to have [facility] RN to see him on "next week" [name] of coordinator of [facility] ([phone])contacted about new home health orders pt agreed to [facility] will fax orders to [facility] when ready ([phone]) Attempts to reach Dr Larina Bras king staff at [phone] but without success.

## 2011-12-16 ENCOUNTER — Encounter (HOSPITAL_BASED_OUTPATIENT_CLINIC_OR_DEPARTMENT_OTHER): Payer: Medicare Other | Attending: General Surgery

## 2011-12-16 DIAGNOSIS — E1169 Type 2 diabetes mellitus with other specified complication: Secondary | ICD-10-CM | POA: Insufficient documentation

## 2011-12-16 DIAGNOSIS — L89609 Pressure ulcer of unspecified heel, unspecified stage: Secondary | ICD-10-CM | POA: Insufficient documentation

## 2011-12-16 DIAGNOSIS — L8990 Pressure ulcer of unspecified site, unspecified stage: Secondary | ICD-10-CM | POA: Insufficient documentation

## 2011-12-16 DIAGNOSIS — L97409 Non-pressure chronic ulcer of unspecified heel and midfoot with unspecified severity: Secondary | ICD-10-CM | POA: Insufficient documentation

## 2011-12-30 ENCOUNTER — Encounter (HOSPITAL_BASED_OUTPATIENT_CLINIC_OR_DEPARTMENT_OTHER): Payer: Medicare Other

## 2012-01-06 ENCOUNTER — Observation Stay (HOSPITAL_COMMUNITY)
Admission: EM | Admit: 2012-01-06 | Discharge: 2012-01-09 | Disposition: A | Discharge status: Skilled Nursing Facility | Payer: Medicare Other | Attending: Internal Medicine | Admitting: Internal Medicine

## 2012-01-06 ENCOUNTER — Emergency Department (HOSPITAL_COMMUNITY): Payer: Medicare Other

## 2012-01-06 ENCOUNTER — Encounter (HOSPITAL_COMMUNITY): Payer: Self-pay | Admitting: Emergency Medicine

## 2012-01-06 DIAGNOSIS — L02619 Cutaneous abscess of unspecified foot: Secondary | ICD-10-CM | POA: Insufficient documentation

## 2012-01-06 DIAGNOSIS — I798 Other disorders of arteries, arterioles and capillaries in diseases classified elsewhere: Secondary | ICD-10-CM

## 2012-01-06 DIAGNOSIS — E1149 Type 2 diabetes mellitus with other diabetic neurological complication: Secondary | ICD-10-CM | POA: Insufficient documentation

## 2012-01-06 DIAGNOSIS — Z7982 Long term (current) use of aspirin: Secondary | ICD-10-CM | POA: Insufficient documentation

## 2012-01-06 DIAGNOSIS — Z9181 History of falling: Secondary | ICD-10-CM | POA: Insufficient documentation

## 2012-01-06 DIAGNOSIS — E119 Type 2 diabetes mellitus without complications: Secondary | ICD-10-CM | POA: Diagnosis present

## 2012-01-06 DIAGNOSIS — L03115 Cellulitis of right lower limb: Secondary | ICD-10-CM

## 2012-01-06 DIAGNOSIS — D72829 Elevated white blood cell count, unspecified: Secondary | ICD-10-CM | POA: Insufficient documentation

## 2012-01-06 DIAGNOSIS — E1159 Type 2 diabetes mellitus with other circulatory complications: Secondary | ICD-10-CM

## 2012-01-06 DIAGNOSIS — L97519 Non-pressure chronic ulcer of other part of right foot with unspecified severity: Secondary | ICD-10-CM

## 2012-01-06 DIAGNOSIS — E1129 Type 2 diabetes mellitus with other diabetic kidney complication: Secondary | ICD-10-CM | POA: Diagnosis present

## 2012-01-06 DIAGNOSIS — H548 Legal blindness, as defined in USA: Secondary | ICD-10-CM | POA: Insufficient documentation

## 2012-01-06 DIAGNOSIS — D649 Anemia, unspecified: Secondary | ICD-10-CM | POA: Insufficient documentation

## 2012-01-06 DIAGNOSIS — Z951 Presence of aortocoronary bypass graft: Secondary | ICD-10-CM

## 2012-01-06 DIAGNOSIS — E1169 Type 2 diabetes mellitus with other specified complication: Principal | ICD-10-CM | POA: Insufficient documentation

## 2012-01-06 DIAGNOSIS — I2510 Atherosclerotic heart disease of native coronary artery without angina pectoris: Secondary | ICD-10-CM

## 2012-01-06 DIAGNOSIS — N182 Chronic kidney disease, stage 2 (mild): Secondary | ICD-10-CM | POA: Insufficient documentation

## 2012-01-06 DIAGNOSIS — R296 Repeated falls: Secondary | ICD-10-CM

## 2012-01-06 DIAGNOSIS — L97409 Non-pressure chronic ulcer of unspecified heel and midfoot with unspecified severity: Secondary | ICD-10-CM | POA: Insufficient documentation

## 2012-01-06 DIAGNOSIS — E1142 Type 2 diabetes mellitus with diabetic polyneuropathy: Secondary | ICD-10-CM

## 2012-01-06 DIAGNOSIS — E785 Hyperlipidemia, unspecified: Secondary | ICD-10-CM

## 2012-01-06 DIAGNOSIS — L03119 Cellulitis of unspecified part of limb: Secondary | ICD-10-CM | POA: Insufficient documentation

## 2012-01-06 DIAGNOSIS — Z794 Long term (current) use of insulin: Secondary | ICD-10-CM | POA: Insufficient documentation

## 2012-01-06 DIAGNOSIS — I10 Essential (primary) hypertension: Secondary | ICD-10-CM

## 2012-01-06 DIAGNOSIS — Z950 Presence of cardiac pacemaker: Secondary | ICD-10-CM

## 2012-01-06 DIAGNOSIS — L0390 Cellulitis, unspecified: Secondary | ICD-10-CM | POA: Diagnosis present

## 2012-01-06 DIAGNOSIS — L97509 Non-pressure chronic ulcer of other part of unspecified foot with unspecified severity: Secondary | ICD-10-CM | POA: Insufficient documentation

## 2012-01-06 DIAGNOSIS — N183 Chronic kidney disease, stage 3 unspecified: Secondary | ICD-10-CM

## 2012-01-06 DIAGNOSIS — I129 Hypertensive chronic kidney disease with stage 1 through stage 4 chronic kidney disease, or unspecified chronic kidney disease: Secondary | ICD-10-CM | POA: Insufficient documentation

## 2012-01-06 HISTORY — DX: Type 2 diabetes mellitus with diabetic polyneuropathy: E11.42

## 2012-01-06 HISTORY — DX: Angina pectoris, unspecified: I20.9

## 2012-01-06 HISTORY — DX: Presence of cardiac pacemaker: Z95.0

## 2012-01-06 HISTORY — DX: Cellulitis of right lower limb: L03.115

## 2012-01-06 HISTORY — DX: Non-pressure chronic ulcer of other part of right foot with unspecified severity: L97.519

## 2012-01-06 HISTORY — DX: Type 2 diabetes mellitus without complications: E11.9

## 2012-01-06 HISTORY — DX: Blindness, one eye, unspecified eye: H54.40

## 2012-01-06 HISTORY — DX: Repeated falls: R29.6

## 2012-01-06 LAB — URINALYSIS, ROUTINE W REFLEX MICROSCOPIC
Bilirubin Urine: NEGATIVE
Nitrite: NEGATIVE
Specific Gravity, Urine: 1.017 (ref 1.005–1.030)
Urobilinogen, UA: 0.2 mg/dL (ref 0.0–1.0)
pH: 5.5 (ref 5.0–8.0)

## 2012-01-06 LAB — CBC WITH DIFFERENTIAL/PLATELET
Basophils Absolute: 0 10*3/uL (ref 0.0–0.1)
HCT: 30.7 % — ABNORMAL LOW (ref 39.0–52.0)
Hemoglobin: 10.1 g/dL — ABNORMAL LOW (ref 13.0–17.0)
Lymphocytes Relative: 10 % — ABNORMAL LOW (ref 12–46)
Monocytes Absolute: 1.5 10*3/uL — ABNORMAL HIGH (ref 0.1–1.0)
Monocytes Relative: 12 % (ref 3–12)
Neutro Abs: 9.7 10*3/uL — ABNORMAL HIGH (ref 1.7–7.7)
Neutrophils Relative %: 76 % (ref 43–77)
WBC: 12.7 10*3/uL — ABNORMAL HIGH (ref 4.0–10.5)

## 2012-01-06 LAB — BASIC METABOLIC PANEL
Chloride: 98 mEq/L (ref 96–112)
GFR calc Af Amer: 58 mL/min — ABNORMAL LOW (ref >90)
GFR calc non Af Amer: 50 mL/min — ABNORMAL LOW (ref >90)
Potassium: 4.4 mEq/L (ref 3.5–5.1)
Sodium: 132 mEq/L — ABNORMAL LOW (ref 135–145)

## 2012-01-06 LAB — HEMOGLOBIN A1C: Hgb A1c MFr Bld: 8.7 % — ABNORMAL HIGH (ref <5.7)

## 2012-01-06 MED ORDER — LATANOPROST 0.005 % OP SOLN
1.0000 [drp] | Freq: Every day | OPHTHALMIC | Status: DC
  Administered 2012-01-06 – 2012-01-08 (×3): 1 [drp] via OPHTHALMIC
  Filled 2012-01-06 (×2): qty 2.5

## 2012-01-06 MED ORDER — SODIUM CHLORIDE 0.9 % IV SOLN
INTRAVENOUS | Status: DC
  Administered 2012-01-06 – 2012-01-08 (×4): via INTRAVENOUS

## 2012-01-06 MED ORDER — SENNOSIDES-DOCUSATE SODIUM 8.6-50 MG PO TABS
2.0000 | ORAL_TABLET | Freq: Every day | ORAL | Status: DC
  Administered 2012-01-06 – 2012-01-08 (×3): 2 via ORAL
  Filled 2012-01-06 (×4): qty 2

## 2012-01-06 MED ORDER — ENOXAPARIN SODIUM 40 MG/0.4ML ~~LOC~~ SOLN
40.0000 mg | SUBCUTANEOUS | Status: DC
  Administered 2012-01-06 – 2012-01-08 (×3): 40 mg via SUBCUTANEOUS
  Filled 2012-01-06 (×4): qty 0.4

## 2012-01-06 MED ORDER — INSULIN GLARGINE 100 UNIT/ML ~~LOC~~ SOLN
20.0000 [IU] | Freq: Every day | SUBCUTANEOUS | Status: DC
  Administered 2012-01-06 – 2012-01-08 (×3): 20 [IU] via SUBCUTANEOUS

## 2012-01-06 MED ORDER — DORZOLAMIDE HCL-TIMOLOL MAL 2-0.5 % OP SOLN
1.0000 [drp] | Freq: Two times a day (BID) | OPHTHALMIC | Status: DC
  Administered 2012-01-06 – 2012-01-09 (×6): 1 [drp] via OPHTHALMIC
  Filled 2012-01-06: qty 10

## 2012-01-06 MED ORDER — CEFAZOLIN SODIUM 1-5 GM-% IV SOLN
1.0000 g | Freq: Once | INTRAVENOUS | Status: AC
  Administered 2012-01-06: 1 g via INTRAVENOUS
  Filled 2012-01-06: qty 50

## 2012-01-06 MED ORDER — SODIUM CHLORIDE 0.9 % IV SOLN: INTRAVENOUS | Status: DC

## 2012-01-06 MED ORDER — ONDANSETRON HCL 4 MG/2ML IJ SOLN: 4.0000 mg | Freq: Four times a day (QID) | INTRAMUSCULAR | Status: DC | PRN

## 2012-01-06 MED ORDER — INSULIN ASPART 100 UNIT/ML ~~LOC~~ SOLN
0.0000 [IU] | Freq: Three times a day (TID) | SUBCUTANEOUS | Status: DC
  Administered 2012-01-06: 2 [IU] via SUBCUTANEOUS
  Administered 2012-01-07: 5 [IU] via SUBCUTANEOUS
  Administered 2012-01-07: 1 [IU] via SUBCUTANEOUS
  Administered 2012-01-08: 3 [IU] via SUBCUTANEOUS
  Administered 2012-01-08: 2 [IU] via SUBCUTANEOUS
  Administered 2012-01-08: 3 [IU] via SUBCUTANEOUS
  Administered 2012-01-09: 2 [IU] via SUBCUTANEOUS

## 2012-01-06 MED ORDER — INSULIN ASPART 100 UNIT/ML ~~LOC~~ SOLN
3.0000 [IU] | Freq: Three times a day (TID) | SUBCUTANEOUS | Status: DC
  Administered 2012-01-06 – 2012-01-09 (×8): 3 [IU] via SUBCUTANEOUS

## 2012-01-06 MED ORDER — ACETAMINOPHEN 650 MG RE SUPP: 650.0000 mg | Freq: Four times a day (QID) | RECTAL | Status: DC | PRN

## 2012-01-06 MED ORDER — VANCOMYCIN HCL IN DEXTROSE 1-5 GM/200ML-% IV SOLN
1000.0000 mg | INTRAVENOUS | Status: DC
  Administered 2012-01-06 – 2012-01-08 (×3): 1000 mg via INTRAVENOUS
  Filled 2012-01-06 (×4): qty 200

## 2012-01-06 MED ORDER — ASPIRIN 325 MG PO TABS
325.0000 mg | ORAL_TABLET | Freq: Every day | ORAL | Status: DC
  Administered 2012-01-07 – 2012-01-09 (×3): 325 mg via ORAL
  Filled 2012-01-06 (×3): qty 1

## 2012-01-06 MED ORDER — CEFTRIAXONE SODIUM 1 G IJ SOLR
1.0000 g | Freq: Every day | INTRAMUSCULAR | Status: DC
  Administered 2012-01-06 – 2012-01-08 (×3): 1 g via INTRAVENOUS
  Filled 2012-01-06 (×4): qty 10

## 2012-01-06 MED ORDER — INSULIN ASPART 100 UNIT/ML ~~LOC~~ SOLN
0.0000 [IU] | Freq: Every day | SUBCUTANEOUS | Status: DC
  Administered 2012-01-07: 3 [IU] via SUBCUTANEOUS
  Administered 2012-01-08: 2 [IU] via SUBCUTANEOUS

## 2012-01-06 MED ORDER — INSULIN ASPART 100 UNIT/ML ~~LOC~~ SOLN
8.0000 [IU] | Freq: Once | SUBCUTANEOUS | Status: AC
  Administered 2012-01-06: 8 [IU] via SUBCUTANEOUS
  Filled 2012-01-06: qty 1

## 2012-01-06 MED ORDER — ACETAMINOPHEN 325 MG PO TABS: 650.0000 mg | ORAL_TABLET | Freq: Four times a day (QID) | ORAL | Status: DC | PRN

## 2012-01-06 MED ORDER — ONDANSETRON HCL 4 MG PO TABS: 4.0000 mg | ORAL_TABLET | Freq: Four times a day (QID) | ORAL | Status: DC | PRN

## 2012-01-06 MED ORDER — HYDROCODONE-ACETAMINOPHEN 5-325 MG PO TABS: 1.0000 | ORAL_TABLET | ORAL | Status: DC | PRN

## 2012-01-06 NOTE — ED Notes (Signed)
Per pt's daughter. Dr. Jeanene Erb her and told her to bring her father here to the hospital due to a diabetic ulcer that was not getting better.

## 2012-01-06 NOTE — ED Notes (Signed)
Report given to [facility] on 6700.

## 2012-01-06 NOTE — Progress Notes (Signed)
ANTIBIOTIC CONSULT NOTE - INITIAL  Pharmacy Consult for Vancomycin Indication: R foot cellulitis  No Known Allergies  Patient Measurements: Height: 5\' 10"  (177.8 cm) Weight: 190 lb 12.8 oz (86.546 kg) IBW/kg (Calculated) : 73   Vital Signs: Temp: 97.8 F (36.6 C) (10/25 1426) Temp src: Oral (10/25 1426) BP: 159/60 mmHg (10/25 1426) Pulse Rate: 73  (10/25 1426) Intake/Output from previous day:   Intake/Output from this shift: Total I/O In: -  Out: 300 [Urine:300]  Labs:  [facility] 01/06/12 0959  WBC 12.7*  HGB 10.1*  PLT 122*  LABCREA --  CREATININE 1.20   Estimated Creatinine Clearance: 39.7 ml/min (by C-G formula based on Cr of 1.2). No results found for this basename: VANCOTROUGH:2,VANCOPEAK:2,VANCORANDOM:2,GENTTROUGH:2,GENTPEAK:2,GENTRANDOM:2,TOBRATROUGH:2,TOBRAPEAK:2,TOBRARND:2,AMIKACINPEAK:2,AMIKACINTROU:2,AMIKACIN:2, in the last 72 hours   Microbiology: No results found for this or any previous visit (from the past 720 hour(s)).  Medical History: Past Medical History  Diagnosis Date  . HTN (hypertension)   . Atherosclerotic heart disease   . Diabetes mellitus   . Dyslipidemia   . Glaucoma(365)   . Neuropathy     post secondary to diabetes  . Blood transfusion     Assessment: 78 y.o. M who was brought into the [facility] by his daughter with a worsening R foot ulcer. Foot X-ray shows no underlying osteomyelitis. Pharmacy was consulted to start Vancomcyin for empiric cellulitis coverage. SCr 1.2, CrCl~30-40 ml/min, Wt~86.5 kg  Goal of Therapy:  Vancomycin trough level 10-15 mcg/ml  Plan:  1. Vancomycin 1g IV every 24 hours 2. Will continue to follow renal function, culture results, LOT, and antibiotic de-escalation plans   Georgina Pillion, PharmD, BCPS Clinical Pharmacist Pager: [phone] 01/06/2012 4:45 PM

## 2012-01-06 NOTE — H&P (Signed)
[facility] History and Physical  [name] WNU:[id number] DOB: 1916-03-24 DOA: 01/06/2012   PCP: Ginette Otto, MD    Chief Complaint:  Chief Complaint  Patient presents with  . Foot Ulcer     HPI: [name] [name] is a 78 y.o. male with hx of DM, peripheral neuropathy and foot ulcer who was brought ot the ED today due to frequent falls and worsening of the appearance of his foot ulcer. According to the patient and his daughter he has been unable to move around the house without falling multiple times.    Review of Systems:  The patient has decreased hearing, constipation and he wears glasses  The patient denies anorexia, fever, weight loss,, vision loss,  hoarseness, chest pain, syncope, dyspnea on exertion, peripheral edema, hemoptysis, abdominal pain, melena, hematochezia, severe indigestion/heartburn, hematuria, incontinence, genital sores, muscle weakness, suspicious skin lesions, transient blindness, depression, unusual weight change  Past Medical History  Diagnosis Date  . HTN (hypertension)   . Atherosclerotic heart disease   . Diabetes mellitus   . Dyslipidemia   . Glaucoma(365)   . Neuropathy     post secondary to diabetes  . Blood transfusion    Past Surgical History  Procedure Date  . US echocardiography 07/08/2009  . Status post cabg 1992  . Bypass graft     6 bypass surgeries   Social History:  reports that he has never smoked. He has never used smokeless tobacco. He reports that he does not drink alcohol or use illicit drugs.   No Known Allergies  FHx - reviewed and noted to be non contributory to this admission  His daughter is alive and well    Prior to Admission medications   Medication Sig Start Date End Date Taking? Authorizing Provider  aspirin 325 MG tablet Take 325 mg by mouth daily.     Yes Historical Provider, MD  bisacodyl (DULCOLAX) 5 MG EC tablet Take 5 mg by mouth daily as needed. constipation   Yes Historical Provider, MD    dorzolamide-timolol (COSOPT) 22.3-6.8 MG/ML ophthalmic solution Place 1 drop into both eyes 2 (two) times daily. In to affected eye   Yes Historical Provider, MD  Glucosamine-Chondroitin (GLUCOSAMINE CHONDR COMPLEX PO) Take 1 capsule by mouth daily.   Yes Historical Provider, MD  insulin NPH (HUMULIN N,NOVOLIN N) 100 UNIT/ML injection Inject 65 Units into the skin daily before breakfast.    Yes Historical Provider, MD  latanoprost (XALATAN) 0.005 % ophthalmic solution Place 1 drop into both eyes at bedtime.     Yes Historical Provider, MD  wound dressings gel Apply 1 application topically every other day. From wound care center   Yes Historical Provider, MD   Physical Exam: Filed Vitals:   01/06/12 0942 01/06/12 1000 01/06/12 1426  BP: 150/50 136/45 159/60  Pulse: 82 75 73  Temp: 97.7 F (36.5 C)  97.8 F (36.6 C)  TempSrc:   Oral  Resp: 18 16 18   SpO2: 98% 98% 100%     General:  Alert and oriented x2  Eyes: Patient is wearing glasses and has glaucoma, has difficulty with eyesight and is unable to clearly count fingers from 20 feet  ENT: Dry oral mucosa, no pharyngeal exudates  Neck: No jugular venous distention, no thyromegaly  Cardiovascular: Prominent post cardio bypass graft scar, normal S1-S2, regular rate and rhythm without murmurs rubs or gallops  Respiratory: No fremitus, clear to auscultation bilaterally  Abdomen: Soft nontender, bowel sounds diminished, no palpable hepatosplenomegaly  Skin: Pale  dry  Musculoskeletal: Right medial aspect of the calcaneal bone with deep ulceration probing to the bone surrounded by erythema ecchymosis  Psychiatric: Euthymic  Neurologic: Cranial nerves 2-12 intact, strength 5 out of 5 in all 4 extremities, sockets-like distribution loss of sensation in both lower extremities bilaterally  Labs on Admission:  Basic Metabolic Panel:  Lab 01/06/12 1610  NA 132*  K 4.4  CL 98  CO2 27  GLUCOSE 387*  BUN 28*  CREATININE 1.20   CALCIUM 8.9  MG --  PHOS --   Liver Function Tests: No results found for this basename: AST:5,ALT:5,ALKPHOS:5,BILITOT:5,PROT:5,ALBUMIN:5 in the last 168 hours No results found for this basename: LIPASE:5,AMYLASE:5 in the last 168 hours No results found for this basename: AMMONIA:5 in the last 168 hours CBC:  Lab 01/06/12 0959  WBC 12.7*  NEUTROABS 9.7*  HGB 10.1*  HCT 30.7*  MCV 90.3  PLT 122*   Cardiac Enzymes: No results found for this basename: CKTOTAL:5,CKMB:5,CKMBINDEX:5,TROPONINI:5 in the last 168 hours  BNP (last 3 results) No results found for this basename: PROBNP:3 in the last 8760 hours CBG: No results found for this basename: GLUCAP:5 in the last 168 hours  Radiological Exams on Admission: Dg Foot Complete Right  01/06/2012  *RADIOLOGY REPORT*  Clinical Data: Diabetic ulceration of the heel  RIGHT FOOT COMPLETE - 3+ VIEW  Comparison: 11/30/2011  Findings: Again the bones are osteopenic.  There is extensive regional vascular calcification.  There are ordinary degenerative changes at the metatarsal phalangeal joint of the great toe.  Soft tissue ulceration is noted at the heel, but there is no evidence of osteomyelitis by plain radiography.  IMPRESSION: Large healed soft tissue ulceration.  No evidence of underlying osteomyelitis by plain radiography.   Original Report Authenticated By: Thomasenia Sales, M.D.      Assessment/Plan Active Problems:  DIAB W/PERIPH CIRC D/O TYPE II/UNS TYPE UNCNTRL  DYSLIPIDEMIA  Polyneuropathy in diabetes(357.2)  Essential hypertension, benign  CAD  PERIPHERAL ANGIOPATHY DISEASES CLASSIFIED ELSW  RENAL DISEASE, CHRONIC, STAGE III  Cardiac pacemaker in situ  S/P CABG x 6  Cellulitis  Falls frequently   1. Right foot cellulitis and deep diabetic ulcer. Blood cultures have been sent. We'll initiate empiric antibiotics with vancomycin to cover for gram-positive cocci including MRSA and Rocephin to cover for gram-negative rods. Local  care will be with sterile gauze dressing. Long-term care should be at the wound care clinic 2. Diabetes mellitus type 2 with polyneuropathy-uncontrolled. Since the patient is going to be in a medical facility and has an open wound we will use intensive insulin therapy with Lantus and NovoLog 3. Disease status post coronary bypass grafting-continue aspirin without changes 4. Frequent falls, with neuropathy, vision problems-orders physical therapy outpatient therapy evaluation for consideration of  skilled nursing rehabilitation  Code Status: Patient cannot make up his mind and he asked me to talk to his daughter about it. He mentions that he doesn't want to be on machines for years Family Communication: Tillie Fantasia Daughter [phone] Disposition Plan: I think this patient warrants skilled nursing home placement due to his complicated comorbidities, presence of an open wound, frequent falls, difficulty with ambulation and eyesight (48 hrs  anticipated LOS)  Time spent: 45 minutes  Bless Lisenby [facility] Pager [phone]  If 7PM-7AM, please contact night-coverage [email or web address] Password [email or web address] 01/06/2012, 4:08 PM

## 2012-01-06 NOTE — ED Notes (Signed)
All notes prior to this were charted by Darnelle Going RN

## 2012-01-06 NOTE — ED Provider Notes (Signed)
History     CSN: [id number]  Arrival date & time 01/06/12  7829   First MD Initiated Contact with Patient 01/06/12 [phone]      Chief Complaint  Patient presents with  . Foot Ulcer    (Consider location/radiation/quality/duration/timing/severity/associated sxs/prior treatment) HPI Patient has had ulcer at right heel for 3 months. His daughter accompanies him as stage she called Dr. Pete Glatter as home health nurses reported that the ulcer on heel has become larger and patient is unable to care for himself at home. Patient is legally blind has been prone to multiple falls last fell approximately one week ago. Daughter has found him covered with feces in the past. Presently patient is without complaint. He comes for possible placement to a skilled nursing facility. He last saw the wound center approximately 3 weeks ago  Past Medical History  Diagnosis Date  . HTN (hypertension)   . Atherosclerotic heart disease   . Diabetes mellitus   . Dyslipidemia   . Glaucoma(365)   . Neuropathy     post secondary to diabetes  . Blood transfusion     Past Surgical History  Procedure Date  . US echocardiography 07/08/2009  . Status post cabg 1992  . Bypass graft     6 bypass surgeries    No family history on file.  History  Substance Use Topics  . Smoking status: Never Smoker   . Smokeless tobacco: Never Used  . Alcohol Use: No     denies      Review of Systems  Constitutional: Negative.   HENT: Negative.   Eyes: Positive for photophobia and visual disturbance.  Respiratory: Negative.   Cardiovascular: Negative.   Gastrointestinal: Negative.   Musculoskeletal: Negative.   Skin: Positive for wound.  Neurological: Negative.   Hematological: Negative.   Psychiatric/Behavioral: Negative.   All other systems reviewed and are negative.    Allergies  Review of patient's allergies indicates no known allergies.  Home Medications   Current Outpatient Rx  Name Route Sig Dispense  Refill  . ASPIRIN 325 MG PO TABS Oral Take 325 mg by mouth daily.      Marland Kitchen BISACODYL 5 MG PO TBEC Oral Take 5 mg by mouth daily as needed. constipation    . DORZOLAMIDE HCL-TIMOLOL MAL 22.3-6.8 MG/ML OP SOLN Both Eyes Place 1 drop into both eyes 2 (two) times daily. In to affected eye    . GLUCOSAMINE CHONDR COMPLEX PO Oral Take 1 capsule by mouth daily.    . INSULIN ISOPHANE HUMAN 100 UNIT/ML  SUSP Subcutaneous Inject 65 Units into the skin daily before breakfast.     . LATANOPROST 0.005 % OP SOLN Both Eyes Place 1 drop into both eyes at bedtime.      Marland Kitchen CARRASYN V WOUND DRESSING EX GEL Topical Apply 1 application topically every other day. From wound care center      BP 150/50  Pulse 82  Temp 97.7 F (36.5 C)  Resp 18  SpO2 98%  Physical Exam  Nursing note and vitals reviewed. Constitutional: He appears well-developed and well-nourished.  HENT:  Head: Normocephalic and atraumatic.  Eyes: Conjunctivae normal are normal. Pupils are equal, round, and reactive to light.  Neck: Neck supple. No tracheal deviation present. No thyromegaly present.  Cardiovascular: Normal rate and regular rhythm.   No murmur heard. Pulmonary/Chest: Effort normal and breath sounds normal.  Abdominal: Soft. Bowel sounds are normal. He exhibits no distension. There is no tenderness.  Musculoskeletal: Normal range of  motion. He exhibits no edema and no tenderness.  Neurological: He is alert. Coordination normal.  Skin: Skin is warm and dry. No rash noted.       Is approximately silver dollar sized clean-appearing ulcer at the medial aspect of his right heel no drainage minimal surrounding redness around the periphery wound is not tender DP is obtainable by Doppler PT pulses absent toes are warm  Psychiatric: He has a normal mood and affect.    ED Course  Procedures (including critical care time)   Labs Reviewed  BASIC METABOLIC PANEL  CBC WITH DIFFERENTIAL  URINALYSIS, ROUTINE W REFLEX MICROSCOPIC    No results found.   No diagnosis found.  spoke with Dr. Pete Glatter who has initiated attempting [facility]  Results for orders placed during the hospital encounter of 01/06/12  BASIC METABOLIC PANEL      Component Value Range   Sodium 132 (*) 135 - 145 mEq/L   Potassium 4.4  3.5 - 5.1 mEq/L   Chloride 98  96 - 112 mEq/L   CO2 27  19 - 32 mEq/L   Glucose, Bld 387 (*) 70 - 99 mg/dL   BUN 28 (*) 6 - 23 mg/dL   Creatinine, Ser 1.61  0.50 - 1.35 mg/dL   Calcium 8.9  8.4 - 09.6 mg/dL   GFR calc non Af Amer 50 (*) >90 mL/min   GFR calc Af Amer 58 (*) >90 mL/min  CBC WITH DIFFERENTIAL      Component Value Range   WBC 12.7 (*) 4.0 - 10.5 K/uL   RBC 3.40 (*) 4.22 - 5.81 MIL/uL   Hemoglobin 10.1 (*) 13.0 - 17.0 g/dL   HCT 04.5 (*) 40.9 - 81.1 %   MCV 90.3  78.0 - 100.0 fL   MCH 29.7  26.0 - 34.0 pg   MCHC 32.9  30.0 - 36.0 g/dL   RDW 91.4  78.2 - 95.6 %   Platelets 122 (*) 150 - 400 K/uL   Neutrophils Relative 76  43 - 77 %   Neutro Abs 9.7 (*) 1.7 - 7.7 K/uL   Lymphocytes Relative 10 (*) 12 - 46 %   Lymphs Abs 1.3  0.7 - 4.0 K/uL   Monocytes Relative 12  3 - 12 %   Monocytes Absolute 1.5 (*) 0.1 - 1.0 K/uL   Eosinophils Relative 2  0 - 5 %   Eosinophils Absolute 0.2  0.0 - 0.7 K/uL   Basophils Relative 0  0 - 1 %   Basophils Absolute 0.0  0.0 - 0.1 K/uL  URINALYSIS, ROUTINE W REFLEX MICROSCOPIC      Component Value Range   Color, Urine YELLOW  YELLOW   APPearance CLEAR  CLEAR   Specific Gravity, Urine 1.017  1.005 - 1.030   pH 5.5  5.0 - 8.0   Glucose, UA 500 (*) NEGATIVE mg/dL   Hgb urine dipstick NEGATIVE  NEGATIVE   Bilirubin Urine NEGATIVE  NEGATIVE   Ketones, ur NEGATIVE  NEGATIVE mg/dL   Protein, ur NEGATIVE  NEGATIVE mg/dL   Urobilinogen, UA 0.2  0.0 - 1.0 mg/dL   Nitrite NEGATIVE  NEGATIVE   Leukocytes, UA NEGATIVE  NEGATIVE   Dg Foot Complete Right  01/06/2012  *RADIOLOGY REPORT*  Clinical Data:  Diabetic ulceration of the heel  RIGHT FOOT COMPLETE - 3+ VIEW  Comparison: 11/30/2011  Findings: Again the bones are osteopenic.  There is extensive regional  vascular calcification.  There are ordinary degenerative changes at the metatarsal phalangeal joint of the great toe.  Soft tissue ulceration is noted at the heel, but there is no evidence of osteomyelitis by plain radiography.  IMPRESSION: Large healed soft tissue ulceration.  No evidence of underlying osteomyelitis by plain radiography.   Original Report Authenticated By: Thomasenia Sales, M.D.      MDM  Patient has cellulitic appearing periphery around foot ulcer. In light of his immunocompromise state and hyperglycemia, we will place for 23 hour observation for glycemic control, IV antibiotics and IV fluids. Diagnoses #1 foot ulcer with cellulitis #2 hyperglycemia #3 anemia #4 leukocytosis       Doug Sou, MD 01/06/12 1225

## 2012-01-07 LAB — CBC
HCT: 31.8 % — ABNORMAL LOW (ref 39.0–52.0)
MCH: 28.4 pg (ref 26.0–34.0)
MCHC: 32.4 g/dL (ref 30.0–36.0)
MCV: 87.6 fL (ref 78.0–100.0)
Platelets: 147 10*3/uL — ABNORMAL LOW (ref 150–400)
RDW: 14.3 % (ref 11.5–15.5)
WBC: 13.4 10*3/uL — ABNORMAL HIGH (ref 4.0–10.5)

## 2012-01-07 LAB — BASIC METABOLIC PANEL
BUN: 23 mg/dL (ref 6–23)
CO2: 27 mEq/L (ref 19–32)
Calcium: 9.2 mg/dL (ref 8.4–10.5)
Chloride: 104 mEq/L (ref 96–112)
Creatinine, Ser: 1.05 mg/dL (ref 0.50–1.35)
Glucose, Bld: 79 mg/dL (ref 70–99)

## 2012-01-07 LAB — GLUCOSE, CAPILLARY

## 2012-01-07 MED ORDER — POLYETHYLENE GLYCOL 3350 17 G PO PACK
34.0000 g | PACK | Freq: Once | ORAL | Status: AC
  Administered 2012-01-07: 34 g via ORAL
  Filled 2012-01-07: qty 2

## 2012-01-07 NOTE — Evaluation (Signed)
Physical Therapy Evaluation Patient Details Name: [name] [name] MRN: [id number] DOB: [birth year] Today's Date: 01/07/2012 Time: 8657-8469 PT Time Calculation (min): 38 min  PT Assessment / Plan / Recommendation Clinical Impression  Pt. was admitted with cellulitis of right LE and chronic wound right heel.  He has had multiple falls at home and is now unable to continue living alone.  He presents with decreased stability in ambulation and decreased activity tolerance and will benefit from acute PT to address  these and below issues.    PT Assessment  Patient needs continued PT services    Follow Up Recommendations  Post acute inpatient;Supervision/Assistance - 24 hour;Supervision for mobility/OOB    Does the patient have the potential to tolerate intense rehabilitation   No, Recommend SNF  Barriers to Discharge Decreased caregiver support      Equipment Recommendations  None recommended by PT    Recommendations for Other Services     Frequency Min 3X/week    Precautions / Restrictions Precautions Precautions: Fall Required Braces or Orthoses: Other Brace/Splint (post op shoe for right foot) Other Brace/Splint: post op shoe for R foot Restrictions Weight Bearing Restrictions: No   Pertinent Vitals/Pain Denies pain      Mobility  Bed Mobility Bed Mobility: Supine to Sit;Sit to Supine Supine to Sit: 5: Supervision Sit to Supine: Not Tested (comment) Details for Bed Mobility Assistance: pt. dependent on rail to assist self; extra time needed and with increased effort Transfers Transfers: Sit to Stand;Stand to Sit Sit to Stand: 4: Min assist;From bed;With upper extremity assist Stand to Sit: 4: Min assist;To chair/3-in-1;With armrests Details for Transfer Assistance: requires frequent cues for hand placement and for safety Ambulation/Gait Ambulation/Gait Assistance: 4: Min assist Ambulation Distance (Feet): 5 Feet Assistive device: Rolling walker Ambulation/Gait Assistance  Details: Pt. needes assist to safely manage RW in tight quarters, min assist for safety and stability Gait Pattern: Step-to pattern;Trunk flexed Gait velocity: slowed Stairs: No    Shoulder Instructions     Exercises     PT Diagnosis: Difficulty walking  PT Problem List: Decreased strength;Decreased activity tolerance;Decreased balance;Decreased mobility;Decreased knowledge of use of DME;Decreased knowledge of precautions;Impaired sensation PT Treatment Interventions: DME instruction;Gait training;Functional mobility training;Therapeutic activities;Therapeutic exercise;Balance training;Patient/family education   PT Goals Acute Rehab PT Goals PT Goal Formulation: With patient Time For Goal Achievement: 01/14/12 Potential to Achieve Goals: Good Pt will go Supine/Side to Sit: with modified independence PT Goal: Supine/Side to Sit - Progress: Goal set today Pt will go Sit to Supine/Side: with modified independence PT Goal: Sit to Supine/Side - Progress: Goal set today Pt will go Sit to Stand: with supervision PT Goal: Sit to Stand - Progress: Goal set today Pt will go Stand to Sit: with supervision PT Goal: Stand to Sit - Progress: Goal set today Pt will Transfer Bed to Chair/Chair to Bed: with supervision PT Transfer Goal: Bed to Chair/Chair to Bed - Progress: Goal set today Pt will Ambulate: 51 - 150 feet;with min assist;with rolling walker PT Goal: Ambulate - Progress: Goal set today  Visit Information  Last PT Received On: 01/07/12 Assistance Needed: +1    Subjective Data  Subjective: "I'm supposed to go to a nursing home on [address]" Patient Stated Goal: "I just want to be back home"   Prior Functioning  Home Living Lives With: Alone Available Help at Discharge: Skilled Nursing Facility Home Adaptive Equipment: Walker - rolling;Wheelchair - Careers adviser (comment);Walker - four wheeled (toilet riser) Prior Function Level of Independence: Needs assistance Needs  Assistance: Bathing;Meal Prep;Light Housekeeping Bath: Minimal Meal Prep: Moderate Light Housekeeping: Moderate Able to Take Stairs?: Yes Driving: No Vocation: Retired Musician: No difficulties Dominant Hand: Right    Cognition  Overall Cognitive Status: Appears within functional limits for tasks assessed/performed (very anxious about being in the hospital) Arousal/Alertness: Awake/alert Orientation Level: Oriented X4 / Intact Behavior During Session: [facility] for tasks performed Cognition - Other Comments: focused on multiple services that came into his home PTA and what all he is being asked to do,. Says "I just want to rest, I'm 78 years old"    Extremity/Trunk Assessment Right Upper Extremity Assessment RUE ROM/Strength/Tone: WFL for tasks assessed RUE Sensation: WFL - Light Touch RUE Coordination: WFL - gross/fine motor Left Upper Extremity Assessment LUE ROM/Strength/Tone: WFL for tasks assessed LUE Sensation: WFL - Light Touch LUE Coordination: WFL - gross/fine motor Right Lower Extremity Assessment RLE ROM/Strength/Tone: WFL for tasks assessed RLE Sensation: History of peripheral neuropathy RLE Coordination: WFL - gross/fine motor Left Lower Extremity Assessment LLE ROM/Strength/Tone: WFL for tasks assessed LLE Sensation: History of peripheral neuropathy LLE Coordination: WFL - gross/fine motor Trunk Assessment Trunk Assessment: Normal   Balance    End of Session PT - End of Session Equipment Utilized During Treatment: Gait belt;Other (comment) (right foot post op shoe) Activity Tolerance: Patient tolerated treatment well Patient left: in chair;with call bell/phone within reach;Other (comment) (pt. in video monitored room) Nurse Communication: Mobility status  GP Functional Assessment Tool Used: clinical judgement Functional Limitation: Mobility: Walking and moving around Mobility: Walking and Moving Around Current Status ([id number]): At least 40 percent  but less than 60 percent impaired, limited or restricted Mobility: Walking and Moving Around Goal Status [phone]): At least 20 percent but less than 40 percent impaired, limited or restricted   Ferman Hamming 01/07/2012, 9:13 AM Weldon Picking PT Acute Rehab Services [phone] Beeper [phone]

## 2012-01-07 NOTE — Progress Notes (Signed)
Clinical Social Work Department BRIEF PSYCHOSOCIAL ASSESSMENT 01/07/2012  Patient:  [name] [name], [name] [name]     Account Number:  [id number]     Admit date:  01/06/2012  Clinical Social Worker: Johnsie Cancel,  Date/Time:  01/07/2012 11:04 AM  Referred by:  Physician  Date Referred:  01/06/2012 Referred for  SNF Placement   Other Referral:   Interview type:  Family Other interview type:    PSYCHOSOCIAL DATA Living Status:  ALONE  Primary support name:  Bonita Quin [phone]) Primary support relationship to patient:  CHILD, ADULT Degree of support available:   Vested, and adequate.    CURRENT CONCERNS Current Concerns  Post-Acute Placement   Other Concerns:    SOCIAL WORK ASSESSMENT / PLAN CSW contacted by RN re: consult placed by MD. CSW contacted the patient's daughter due to the patient's altered mental status. Bonita Quin [phone]) stated that she is aware of the patient's need for post-acute care. CSW provided supportive counseling and education on the SNF process. Bonita Quin was visiting SNF's when CSW called her on the phone. Bonita Quin stated she would call the CSW to inform her of the SNF top choices. CSW will complete FL2 and fax out to [facility].   Assessment/plan status:  Information/Referral to [facility] Other assessment/ plan:   Information/referral to community resources:    PATIENT'S/FAMILY'S RESPONSE TO PLAN OF CARE: Patient's daughter thanked CSW for facilitating SNF search and providing support.    Lia Foyer, LCSWA [facility] Clinical Social Worker Contact #: [phone] (weekend)

## 2012-01-07 NOTE — Progress Notes (Signed)
[facility] PROGRESS NOTE  [name] [name] ZOX:[id number] DOB: 1916/05/23 DOA: 19-Jan-2012 PCP: Ginette Otto, MD  Assessment/Plan: 1. Right foot cellulitis and deep diabetic ulcer. Blood cultures have been sent on admission. Initiated empiric antibiotics with vancomycin to cover for gram-positive cocci including MRSA and Rocephin to cover for gram-negative rods on 2023/01/19 - we shall switch to oral abx at time of dc . Local care will be with sterile gauze dressing. Long-term care should be at the wound care clinic 2. Diabetes mellitus type 2 with polyneuropathy-uncontrolled. A1C is 8.7 Since the patient is going to be in a medical facility and has an open wound we will use intensive insulin therapy with Lantus and NovoLog 3. Coronary artery disease status post coronary bypass grafting-continue aspirin without changes 4. Frequent falls, with neuropathy, vision problems- awaiting skilled nursing rehabilitation placement  Code Status: DNR Family Communication: daughter Disposition Plan: snf   Consultants:  none  Procedures:  none  Antibiotics: Vanc Rocephin   HPI/Subjective: No new issues   Objective: Filed Vitals:   19-Jan-2012 1854 01-19-12 2127 01/07/12 0613 01/07/12 0907  BP: 119/49 120/47 156/62 124/55  Pulse: 79 67 74 75  Temp: 97.7 F (36.5 C) 98.3 F (36.8 C) 97.4 F (36.3 C) 98.7 F (37.1 C)  TempSrc: Oral Oral Oral   Resp: 18 18 17 16   Height:      Weight:  86.8 kg (191 lb 5.8 oz)    SpO2: 98% 100% 100% 96%    Intake/Output Summary (Last 24 hours) at 01/07/12 1019 Last data filed at 01/07/12 0908  Gross per 24 hour  Intake    240 ml  Output   2250 ml  Net  -2010 ml   Filed Weights   01/19/2012 1500 01/19/12 2127  Weight: 86.546 kg (190 lb 12.8 oz) 86.8 kg (191 lb 5.8 oz)    Exam:   General:  axox3  Cardiovascular: RRR, no M,R,G  Respiratory: ctab  Abdomen: soft, nt  Right foot with calcaneal erythema   Data Reviewed: Basic Metabolic  Panel:  Lab 01/07/12 0547 19-Jan-2012 0959  NA 137 132*  K 4.7 4.4  CL 104 98  CO2 27 27  GLUCOSE 79 387*  BUN 23 28*  CREATININE 1.05 1.20  CALCIUM 9.2 8.9  MG -- --  PHOS -- --   Liver Function Tests: No results found for this basename: AST:5,ALT:5,ALKPHOS:5,BILITOT:5,PROT:5,ALBUMIN:5 in the last 168 hours No results found for this basename: LIPASE:5,AMYLASE:5 in the last 168 hours No results found for this basename: AMMONIA:5 in the last 168 hours CBC:  Lab 01/07/12 0547 Jan 19, 2012 0959  WBC 13.4* 12.7*  NEUTROABS -- 9.7*  HGB 10.3* 10.1*  HCT 31.8* 30.7*  MCV 87.6 90.3  PLT 147* 122*   Cardiac Enzymes: No results found for this basename: CKTOTAL:5,CKMB:5,CKMBINDEX:5,TROPONINI:5 in the last 168 hours BNP (last 3 results) No results found for this basename: PROBNP:3 in the last 8760 hours CBG:  Lab 01/07/12 0741 01/07/12 0319 01-19-2012 2124 Jan 19, 2012 1702  GLUCAP 82 69* 180* 181*    No results found for this or any previous visit (from the past 240 hour(s)).   Studies: Dg Foot Complete Right  Jan 19, 2012  *RADIOLOGY REPORT*  Clinical Data: Diabetic ulceration of the heel  RIGHT FOOT COMPLETE - 3+ VIEW  Comparison: 11/30/2011  Findings: Again the bones are osteopenic.  There is extensive regional vascular calcification.  There are ordinary degenerative changes at the metatarsal phalangeal joint of the great toe.  Soft tissue ulceration is noted  at the heel, but there is no evidence of osteomyelitis by plain radiography.  IMPRESSION: Large healed soft tissue ulceration.  No evidence of underlying osteomyelitis by plain radiography.   Original Report Authenticated By: Thomasenia Sales, M.D.     Scheduled Meds:   . aspirin  325 mg Oral Daily  .  ceFAZolin (ANCEF) IV  1 g Intravenous Once  . cefTRIAXone (ROCEPHIN)  IV  1 g Intravenous QHS  . dorzolamide-timolol  1 drop Both Eyes BID  . enoxaparin (LOVENOX) injection  40 mg Subcutaneous Q24H  . insulin aspart  0-5 Units  Subcutaneous QHS  . insulin aspart  0-9 Units Subcutaneous TID WC  . insulin aspart  3 Units Subcutaneous TID WC  . insulin aspart  8 Units Subcutaneous Once  . insulin glargine  20 Units Subcutaneous QHS  . latanoprost  1 drop Both Eyes QHS  . senna-docusate  2 tablet Oral QHS  . vancomycin  1,000 mg Intravenous Q24H  . DISCONTD: sodium chloride   Intravenous STAT   Continuous Infusions:   . sodium chloride 50 mL/hr at 01/07/12 4098    Active Problems:  DIAB W/PERIPH CIRC D/O TYPE II/UNS TYPE UNCNTRL  DYSLIPIDEMIA  Polyneuropathy in diabetes(357.2)  Essential hypertension, benign  CAD  PERIPHERAL ANGIOPATHY DISEASES CLASSIFIED ELSW  RENAL DISEASE, CHRONIC, STAGE III  Cardiac pacemaker in situ  S/P CABG x 6  Cellulitis  Falls frequently     [name] [name]  [facility] Pager [phone]. If 8PM-8AM, please contact night-coverage at [email or web address], password [facility] 01/07/2012, 10:19 AM  LOS: 1 day

## 2012-01-07 NOTE — Progress Notes (Addendum)
Clinical Social Work Department CLINICAL SOCIAL WORK PLACEMENT NOTE 01/07/2012  Patient:  [name] [name], [name] [name]  Account Number:  [id number] Admit date:  01/06/2012  Clinical Social Worker: Lia Foyer LCSWA ,  Date/time:  01/07/2012 11:11 AM  Clinical Social Work is seeking post-discharge placement for this patient at the following level of care:   SKILLED NURSING   (*CSW will update this form in Epic as items are completed)   01/07/2012  Patient/family provided with [facility] Department of Clinical Social Work's list of facilities offering this level of care within the geographic area requested by the patient (or if unable, by the patient's family).  01/07/2012  Patient/family informed of their freedom to choose among providers that offer the needed level of care, that participate in Medicare, Medicaid or managed care program needed by the patient, have an available bed and are willing to accept the patient.  01/07/2012  Patient/family informed of [facility]' ownership interest in [facility], as well as of the fact that they are under no obligation to receive care at this facility.  01/07/2012 PASARR submitted to EDS on  01/07/2012 PASARR number received from EDS on   01/06/2012 FL2 transmitted to all facilities in geographic area requested by pt/family on   FL2 transmitted to all facilities within larger geographic area on   Patient informed that his/her managed care company has contracts with or will negotiate with  certain facilities, including the following:     Patient/family informed of bed offers received:   Patient chooses bed at  Physician recommends and patient chooses bed at    Patient to be transferred to  on   Patient to be transferred to facility by   The following physician request were entered in Epic:   Additional Comments:  Lia Foyer, LCSWA [facility] Clinical Social Worker Contact #: [phone] (weekend)

## 2012-01-08 LAB — CBC
Hemoglobin: 9.6 g/dL — ABNORMAL LOW (ref 13.0–17.0)
MCH: 29 pg (ref 26.0–34.0)
MCV: 88.5 fL (ref 78.0–100.0)
Platelets: 134 10*3/uL — ABNORMAL LOW (ref 150–400)
RBC: 3.31 MIL/uL — ABNORMAL LOW (ref 4.22–5.81)
WBC: 9.1 10*3/uL (ref 4.0–10.5)

## 2012-01-08 LAB — GLUCOSE, CAPILLARY
Glucose-Capillary: 223 mg/dL — ABNORMAL HIGH (ref 70–99)
Glucose-Capillary: 157 mg/dL — ABNORMAL HIGH (ref 70–99)
Glucose-Capillary: 206 mg/dL — ABNORMAL HIGH (ref 70–99)

## 2012-01-08 NOTE — Progress Notes (Signed)
ANTIBIOTIC CONSULT NOTE - Follow-Up  Pharmacy Consult for Vancomycin Indication: R foot cellulitis  Allergies  Allergen Reactions  . Aleve (Naproxen Sodium) Other (See Comments)    "makes me feel crummy; warm or something"   Labs:  [facility] 01/08/12 0635 01/07/12 0547 01/06/12 0959  WBC 9.1 13.4* 12.7*  HGB 9.6* 10.3* 10.1*  PLT 134* 147* 122*  [facility]CREA -- -- --  CREATININE -- 1.05 1.20   Estimated Creatinine Clearance: 45.4 ml/min (by C-G formula based on Cr of 1.05). No results found for this basename: VANCOTROUGH:2,VANCOPEAK:2,VANCORANDOM:2,GENTTROUGH:2,GENTPEAK:2,GENTRANDOM:2,TOBRATROUGH:2,TOBRAPEAK:2,TOBRARND:2,AMIKACINPEAK:2,AMIKACINTROU:2,AMIKACIN:2, in the last 72 hours    Assessment: 78 y.o. M who was brought into the [facility] by his daughter with a worsening R foot ulcer. Foot X-ray shows no underlying osteomyelitis. Pharmacy was consulted to start Vancomcyin for empiric cellulitis coverage.   Blood cultures negative to date, Scr stable, afebrile, WBC WNL  Goal of Therapy:  Vancomycin trough level 10-15 mcg/ml  Plan:  1. Continue Vancomycin 1g IV every 24 hours 2. Will continue to follow renal function, culture results, LOT, and antibiotic de-escalation plans   Okey Regal, PharmD [phone]  01/08/2012 11:51 AM

## 2012-01-08 NOTE — Progress Notes (Signed)
[facility] PROGRESS NOTE  [name] [name] ZOX:[id number] DOB: 1916/04/16 DOA: 01/06/2012 PCP: Ginette Otto, MD  Assessment/Plan: 1. Right foot cellulitis and deep diabetic ulcer. Blood cultures have been sent on admission. Initiated empiric antibiotics with vancomycin to cover for gram-positive cocci including MRSA and Rocephin to cover for gram-negative rods on 10/25 - we shall switch to oral abx at time of dc . Local care will be with sterile gauze dressing. Long-term care should be at the wound care clinic 2. Diabetes mellitus type 2 with polyneuropathy-uncontrolled. A1C is 8.7 Since the patient is going to be in a medical facility and has an open wound we will use intensive insulin therapy with Lantus and NovoLog 3. Coronary artery disease status post coronary bypass grafting-continue aspirin without changes 4. Frequent falls, with neuropathy, vision problems- awaiting skilled nursing rehabilitation placement  Code Status: DNR Family Communication: daughter Disposition Plan: snf   Consultants:  none  Procedures:  none  Antibiotics: Vanc Rocephin   HPI/Subjective: No new issues . Watching [employer] as usual.   Objective: Filed Vitals:   01/07/12 2126 01/08/12 0523 01/08/12 1000 01/08/12 1400  BP: 127/40 110/54 127/48 146/47  Pulse: 75 75 73 70  Temp: 98.5 F (36.9 C) 98.2 F (36.8 C) 97.7 F (36.5 C) 97.1 F (36.2 C)  TempSrc: Oral Oral Oral Oral  Resp: 18 18 16 18   Height:      Weight: 85.503 kg (188 lb 8 oz)     SpO2: 94% 99% 98% 100%    Intake/Output Summary (Last 24 hours) at 01/08/12 1544 Last data filed at 01/08/12 1300  Gross per 24 hour  Intake 2850.42 ml  Output    400 ml  Net 2450.42 ml   Filed Weights   01/06/12 1500 01/06/12 2127 01/07/12 2126  Weight: 86.546 kg (190 lb 12.8 oz) 86.8 kg (191 lb 5.8 oz) 85.503 kg (188 lb 8 oz)    Exam:   General:  axox3  Cardiovascular: RRR, no M,R,G  Respiratory: ctab  Abdomen: soft,  nt  Right foot with calcaneal erythema   Data Reviewed: Basic Metabolic Panel:  Lab 01/07/12 8119 01/06/12 0959  NA 137 132*  K 4.7 4.4  CL 104 98  CO2 27 27  GLUCOSE 79 387*  BUN 23 28*  CREATININE 1.05 1.20  CALCIUM 9.2 8.9  MG -- --  PHOS -- --   Liver Function Tests: No results found for this basename: AST:5,ALT:5,ALKPHOS:5,BILITOT:5,PROT:5,ALBUMIN:5 in the last 168 hours No results found for this basename: LIPASE:5,AMYLASE:5 in the last 168 hours No results found for this basename: AMMONIA:5 in the last 168 hours CBC:  Lab 01/08/12 0635 01/07/12 0547 01/06/12 0959  WBC 9.1 13.4* 12.7*  NEUTROABS -- -- 9.7*  HGB 9.6* 10.3* 10.1*  HCT 29.3* 31.8* 30.7*  MCV 88.5 87.6 90.3  PLT 134* 147* 122*   Cardiac Enzymes: No results found for this basename: CKTOTAL:5,CKMB:5,CKMBINDEX:5,TROPONINI:5 in the last 168 hours BNP (last 3 results) No results found for this basename: PROBNP:3 in the last 8760 hours CBG:  Lab 01/08/12 1145 01/08/12 0736 01/07/12 2134 01/07/12 1701 01/07/12 1145  GLUCAP 157* 223* 265* 272* 148*    Recent Results (from the past 240 hour(s))  CULTURE, BLOOD (ROUTINE X 2)     Status: Normal (Preliminary result)   Collection Time   01/06/12 12:30 PM      Component Value Range Status Comment   Specimen Description BLOOD RIGHT ANTECUBITAL   Final    Special Requests BOTTLES DRAWN  AEROBIC AND ANAEROBIC 10CC   Final    Culture  Setup Time 01/06/2012 17:40   Final    Culture     Final    Value:        BLOOD CULTURE RECEIVED NO GROWTH TO DATE CULTURE WILL BE HELD FOR 5 DAYS BEFORE ISSUING A FINAL NEGATIVE REPORT   Report Status PENDING   Incomplete   CULTURE, BLOOD (ROUTINE X 2)     Status: Normal (Preliminary result)   Collection Time   01/06/12 12:37 PM      Component Value Range Status Comment   Specimen Description BLOOD RIGHT ANTECUBITAL   Final    Special Requests BOTTLES DRAWN AEROBIC ONLY 10CC   Final    Culture  Setup Time 01/06/2012 17:41    Final    Culture     Final    Value:        BLOOD CULTURE RECEIVED NO GROWTH TO DATE CULTURE WILL BE HELD FOR 5 DAYS BEFORE ISSUING A FINAL NEGATIVE REPORT   Report Status PENDING   Incomplete      Studies: No results found.  Scheduled Meds:    . aspirin  325 mg Oral Daily  . cefTRIAXone (ROCEPHIN)  IV  1 g Intravenous QHS  . dorzolamide-timolol  1 drop Both Eyes BID  . enoxaparin (LOVENOX) injection  40 mg Subcutaneous Q24H  . insulin aspart  0-5 Units Subcutaneous QHS  . insulin aspart  0-9 Units Subcutaneous TID WC  . insulin aspart  3 Units Subcutaneous TID WC  . insulin glargine  20 Units Subcutaneous QHS  . latanoprost  1 drop Both Eyes QHS  . polyethylene glycol  34 g Oral Once  . senna-docusate  2 tablet Oral QHS  . vancomycin  1,000 mg Intravenous Q24H   Continuous Infusions:    . sodium chloride 50 mL/hr at 01/08/12 1610    Active Problems:  DIAB W/PERIPH CIRC D/O TYPE II/UNS TYPE UNCNTRL  DYSLIPIDEMIA  Polyneuropathy in diabetes(357.2)  Essential hypertension, benign  CAD  PERIPHERAL ANGIOPATHY DISEASES CLASSIFIED ELSW  RENAL DISEASE, CHRONIC, STAGE III  Cardiac pacemaker in situ  S/P CABG x 6  Cellulitis  Falls frequently     [name] [name]  [facility] Pager [phone]. If 8PM-8AM, please contact night-coverage at [email or web address], password [facility] 01/08/2012, 3:44 PM  LOS: 2 days

## 2012-01-09 DIAGNOSIS — E1159 Type 2 diabetes mellitus with other circulatory complications: Secondary | ICD-10-CM

## 2012-01-09 DIAGNOSIS — Z9181 History of falling: Secondary | ICD-10-CM

## 2012-01-09 LAB — GLUCOSE, CAPILLARY

## 2012-01-09 MED ORDER — INSULIN GLARGINE 100 UNIT/ML ~~LOC~~ SOLN: 20.0000 [IU] | Freq: Every day | SUBCUTANEOUS | Status: AC

## 2012-01-09 MED ORDER — INSULIN ASPART 100 UNIT/ML ~~LOC~~ SOLN: 0.0000 [IU] | Freq: Every day | SUBCUTANEOUS | Status: DC

## 2012-01-09 MED ORDER — SULFAMETHOXAZOLE-TRIMETHOPRIM 800-160 MG PO TABS: 1.0000 | ORAL_TABLET | Freq: Two times a day (BID) | ORAL | Status: AC

## 2012-01-09 NOTE — Progress Notes (Signed)
Pt discharging to [facility] on [address] via ambulance. Assessment unchanged from morning and IV was removed.

## 2012-01-09 NOTE — Progress Notes (Signed)
Pt's home walker was unable to be sent via ambulance to [facility]. Will keep behind nursing station on [address] with pt label on it, for daughter to pick up later this afternoon. Daughter has been notified.

## 2012-01-09 NOTE — Evaluation (Signed)
Occupational Therapy Evaluation Patient Details Name: [name] [name] MRN: [id number] DOB: [birth year] Today's Date: 01/09/2012 Time: 1012-1028 OT Time Calculation (min): 16 min  OT Assessment / Plan / Recommendation Clinical Impression  Pt admitted with chronic right foot ulcer. Pt with history of falls at home per MD note.  Eval session limited by decreased pt participation.  At this time, feel that SNF is safest d/c plan. Will continue to follow acutely.    OT Assessment  Patient needs continued OT Services    Follow Up Recommendations  Skilled nursing facility    Barriers to Discharge Decreased caregiver support 24/7 assist at home not available  Equipment Recommendations   (tbd by OT)    Recommendations for Other Services    Frequency  Min 2X/week    Precautions / Restrictions Precautions Precautions: Fall Required Braces or Orthoses: Other Brace/Splint (post op shoe for right foot) Restrictions Weight Bearing Restrictions: No   Pertinent Vitals/Pain See vitals    ADL  Upper Body Bathing: Simulated;Set up Where Assessed - Upper Body Bathing: Unsupported sitting Lower Body Bathing: Simulated;Minimal assistance Where Assessed - Lower Body Bathing: Unsupported sitting Upper Body Dressing: Simulated;Set up Where Assessed - Upper Body Dressing: Unsupported sitting Lower Body Dressing: Performed;Minimal assistance Where Assessed - Lower Body Dressing: Supported sitting Equipment Used:  (post op shoe) Transfers/Ambulation Related to ADLs: Pt refusing to get OOB despite max encouragement. ADL Comments: Min assist to don post op shoe (right foot). Upon OT arrival, pt reporting he wished to shave and bathe.  When OT set pt up with bath while sitting EOB, pt refused and reported he "doesn't want to do that right now".  Pt declining OOB transfer and became agitated with further encouragement to participate.    OT Diagnosis: Generalized weakness  OT Problem List: Decreased activity  tolerance;Decreased knowledge of use of DME or AE OT Treatment Interventions: Self-care/ADL training;DME and/or AE instruction;Therapeutic activities;Patient/family education   OT Goals Acute Rehab OT Goals OT Goal Formulation: With patient Time For Goal Achievement: 01/16/12 Potential to Achieve Goals: Good ADL Goals Pt Will Perform Grooming: with modified independence;Standing at sink ADL Goal: Grooming - Progress: Goal set today Pt Will Perform Upper Body Dressing: with modified independence;Sitting, chair;Sitting, bed;Unsupported ADL Goal: Upper Body Dressing - Progress: Goal set today Pt Will Perform Lower Body Dressing: with modified independence;Sit to stand from bed;Sit to stand from chair;Unsupported ADL Goal: Lower Body Dressing - Progress: Goal set today Pt Will Transfer to Toilet: with modified independence;Ambulation;with DME;Comfort height toilet ADL Goal: Toilet Transfer - Progress: Goal set today Pt Will Perform Toileting - Clothing Manipulation: with modified independence;Sitting on 3-in-1 or toilet;Standing ADL Goal: Toileting - Clothing Manipulation - Progress: Goal set today Pt Will Perform Toileting - Hygiene: with modified independence;Standing at 3-in-1/toilet;Sit to stand from 3-in-1/toilet ADL Goal: Toileting - Hygiene - Progress: Goal set today Miscellaneous OT Goals Miscellaneous OT Goal #1: Pt will perform bed mobility with mod I in prep for EOB ADLs. OT Goal: Miscellaneous Goal #1 - Progress: Goal set today  Visit Information  Last OT Received On: 01/09/12 Assistance Needed: +1    Subjective Data  Subjective: "I just need to rest. I have a big day ahead."   Prior Functioning     Home Living Lives With: Alone Available Help at Discharge: Skilled Nursing Facility Bathroom Shower/Tub: Nurse, adult Toilet: Standard Home Adaptive Equipment: Walker - rolling;Wheelchair - Careers adviser (comment);Walker - four wheeled Prior Function Level of  Independence: Needs assistance Needs Assistance: Bathing;Meal Prep;Light  Housekeeping Bath: Minimal Meal Prep: Moderate Light Housekeeping: Moderate Able to Take Stairs?: Yes Driving: No Vocation: Retired Comments: Has an aide who comes in "occasionally" to assist with bathing. Communication Communication: No difficulties Dominant Hand: Right         Vision/Perception     Cognition  Overall Cognitive Status: Appears within functional limits for tasks assessed/performed Arousal/Alertness: Awake/alert Orientation Level: Appears intact for tasks assessed Behavior During Session: Agitated Cognition - Other Comments: Agitated when encouraged to participate further in therapy.  Slightly anxious about anticipated d/c plan to SNF.    Extremity/Trunk Assessment Right Upper Extremity Assessment RUE ROM/Strength/Tone: Within functional levels RUE Sensation: WFL - Light Touch;WFL - Proprioception RUE Coordination: WFL - gross/fine motor Left Upper Extremity Assessment LUE ROM/Strength/Tone: Within functional levels LUE Sensation: WFL - Light Touch;WFL - Proprioception LUE Coordination: WFL - gross/fine motor     Mobility Bed Mobility Bed Mobility: Supine to Sit;Sitting - Scoot to Edge of Bed;Sit to Supine Supine to Sit: 5: Supervision;HOB elevated Sitting - Scoot to Edge of Bed: 5: Supervision Sit to Supine: 5: Supervision;HOB flat Details for Bed Mobility Assistance: supervision for safety. Increased time to complete task. Transfers Transfers: Not assessed (pt refusal)     Shoulder Instructions     Exercise     Balance     End of Session OT - End of Session Equipment Utilized During Treatment:  (post op shoe right foot) Activity Tolerance: Treatment limited secondary to agitation (not wanting to get OOB) Patient left: in bed;with call bell/phone within reach Nurse Communication: Other (comment) (pt refusing OOB at this time)  GO Functional Assessment Tool Used:  clinical judgement Functional Limitation: Self care Self Care Current Status ([id number]): At least 20 percent but less than 40 percent impaired, limited or restricted Self Care Goal Status [phone]): 0 percent impaired, limited or restricted  01/09/2012 Cipriano Mile OTR/L Pager [phone] Office [phone]  Cipriano Mile 01/09/2012, 11:41 AM

## 2012-01-09 NOTE — Progress Notes (Signed)
Physical Therapy Treatment Patient Details Name: [name] [name] MRN: [id number] DOB: 12-02-1916 Today's Date: 01/09/2012 Time: 5284-1324 PT Time Calculation (min): 28 min  PT Assessment / Plan / Recommendation Comments on Treatment Session  Admitted with right foot ulcer and difficulty managing at home; Making some improvements with amb and RW management over last PT session; pt Plans to dc to SNF today (not sure if bed is available -- may be tomorrow)    Follow Up Recommendations  Post acute inpatient;Supervision/Assistance - 24 hour;Supervision for mobility/OOB     Does the patient have the potential to tolerate intense rehabilitation  No, Recommend SNF  Barriers to Discharge        Equipment Recommendations  None recommended by PT    Recommendations for Other Services    Frequency Min 3X/week   Plan Discharge plan remains appropriate    Precautions / Restrictions Precautions Precautions: Fall Required Braces or Orthoses: Other Brace/Splint (post op shoe for right foot) Other Brace/Splint: post op shoe for R foot Restrictions Weight Bearing Restrictions: No   Pertinent Vitals/Pain no apparent distress     Mobility  Bed Mobility Bed Mobility: Supine to Sit Supine to Sit: 5: Supervision;With rails;HOB elevated Sitting - Scoot to Edge of Bed: 5: Supervision Sit to Supine: 5: Supervision;HOB flat Details for Bed Mobility Assistance: supervision for safety. Increased time to complete task. Transfers Transfers: Sit to Stand;Stand to Sit Sit to Stand: 4: Min assist;From bed;With upper extremity assist Stand to Sit: 4: Min assist;To chair/3-in-1;With armrests Details for Transfer Assistance: requires frequent cues for hand placement and for safety; also cues to control descent Ambulation/Gait Ambulation/Gait Assistance: 4: Min assist Ambulation Distance (Feet): 12 Feet Assistive device: Rolling walker Ambulation/Gait Assistance Details: Cues for upright posture (it is likely  his trunk flexed posture is quite a habit); Able to manage obstacles in room with minimal cues Gait Pattern: Step-to pattern;Trunk flexed Gait velocity: slowed    Exercises     PT Diagnosis:    PT Problem List:   PT Treatment Interventions:     PT Goals Acute Rehab PT Goals Time For Goal Achievement: 01/14/12 Potential to Achieve Goals: Good Pt will go Supine/Side to Sit: with modified independence PT Goal: Supine/Side to Sit - Progress: Progressing toward goal Pt will go Sit to Supine/Side: with modified independence Pt will go Sit to Stand: with supervision PT Goal: Sit to Stand - Progress: Progressing toward goal Pt will go Stand to Sit: with supervision PT Goal: Stand to Sit - Progress: Progressing toward goal Pt will Ambulate: 51 - 150 feet;with min assist;with rolling walker PT Goal: Ambulate - Progress: Progressing toward goal  Visit Information  Last PT Received On: 01/09/12 Assistance Needed: +1    Subjective Data  Subjective: Pt hopes to dc today; He states he is going to [employer]  Overall Cognitive Status: Appears within functional limits for tasks assessed/performed Arousal/Alertness: Awake/alert Orientation Level: Appears intact for tasks assessed Behavior During Session: [facility] for tasks performed Cognition - Other Comments: Pt pleasant and very willing to talk about his life, his service in WWII, and his work; Needed a little extra encouragement to get OOB    Balance     End of Session PT - End of Session Equipment Utilized During Treatment: Gait belt;Other (comment) (post op shoe R foot) Activity Tolerance: Patient tolerated treatment well Patient left: in chair;with call bell/phone within reach;with family/visitor present Nurse Communication: Mobility status   GP Functional Assessment Tool Used: clinical judgement Functional  Limitation: Mobility: Walking and moving around Mobility: Walking and Moving Around Current Status [phone]): At least 20  percent but less than 40 percent impaired, limited or restricted Mobility: Walking and Moving Around Goal Status [phone]): At least 1 percent but less than 20 percent impaired, limited or restricted   Van Clines [facility] [place], [place] [phone]  01/09/2012, 12:00 PM

## 2012-01-09 NOTE — Discharge Summary (Signed)
Physician Discharge Summary  [name] [name] YQM:[id number] DOB: 1916/06/26 DOA: 01/06/2012  PCP: Ginette Otto, MD  Admit date: 01/06/2012 Discharge date: 01/09/2012  Time spent: 45 minutes  Discharge Diagnoses:   Cellulitis  DIAB W/PERIPH CIRC D/O TYPE II/UNS TYPE UNCNTRL  DYSLIPIDEMIA  Polyneuropathy in diabetes(357.2)  Essential hypertension, benign  CAD  PERIPHERAL ANGIOPATHY DISEASES CLASSIFIED ELSW  RENAL DISEASE, CHRONIC, STAGE III  Cardiac pacemaker in situ  S/P CABG x 6  Falls frequently   Discharge Condition: good  Diet recommendation: diabetic  Filed Weights   01/06/12 2127 01/07/12 2126 01/08/12 2050  Weight: 86.8 kg (191 lb 5.8 oz) 85.503 kg (188 lb 8 oz) 86 kg (189 lb 9.5 oz)    History of present illness:  [name] [name] is a 78 y.o. male with hx of DM, peripheral neuropathy and foot ulcer who was brought ot the ED today due to frequent falls and worsening of the appearance of his foot ulcer. According to the patient and his daughter he has been unable to move around the house without falling multiple times.      Hospital Course:  1. Right foot cellulitis and deep diabetic ulcer. Blood cultures have been sent on admission. Initiated empiric antibiotics with vancomycin to cover for gram-positive cocci including MRSA and Rocephin to cover for gram-negative rods on 10/25 - we switched to Septra DS for 1 week at time of dc . Local care will be with sterile gauze dressing. Long-term care should be at the wound care clinic 2. Diabetes mellitus type 2 with polyneuropathy-uncontrolled. A1C is 8.7 Since the patient is going to be in a medical facility and has an open wound we will use intensive insulin therapy with Lantus and NovoLog 3. Coronary artery disease status post coronary bypass grafting-continue aspirin without changes 4. Frequent falls, with neuropathy, vision problems- skilled nursing rehabilitation placement   Procedures: None  Consultations:  None      Discharge Exam: Filed Vitals:   01/08/12 1700 01/08/12 2050 01/09/12 0524 01/09/12 0833  BP: 158/52 158/64 137/71 137/51  Pulse: 75 75 74 74  Temp: 97.5 F (36.4 C) 98.2 F (36.8 C) 98.2 F (36.8 C) 98 F (36.7 C)  TempSrc: Oral Oral Oral Oral  Resp: 18 18 18 20   Height:      Weight:  86 kg (189 lb 9.5 oz)    SpO2: 100% 100% 94% 98%    General: axox3 Cardiovascular: rrr Respiratory: ctab  Discharge Instructions  Discharge Orders    Future Appointments: Provider: Department: Dept Phone: Center:   01/18/2012 12:30 PM Marinus Maw, MD [facility] [phone] [facility]     Future Orders Please Complete By Expires   Diet Carb Modified      Increase activity slowly          Medication List     As of 01/09/2012  9:47 AM    STOP taking these medications         bisacodyl 5 MG EC tablet   Commonly known as: DULCOLAX      insulin NPH 100 UNIT/ML injection   Commonly known as: HUMULIN N,NOVOLIN N      wound dressings gel      TAKE these medications         aspirin 325 MG tablet   Take 325 mg by mouth daily.      dorzolamide-timolol 22.3-6.8 MG/ML ophthalmic solution   Commonly known as: COSOPT   Place 1 drop into both eyes 2 (two) times  daily. In to affected eye      GLUCOSAMINE CHONDR COMPLEX PO   Take 1 capsule by mouth daily.      insulin aspart 100 UNIT/ML injection   Commonly known as: novoLOG   Inject 0-5 Units into the skin at bedtime.      insulin glargine 100 UNIT/ML injection   Commonly known as: LANTUS   Inject 20 Units into the skin at bedtime.      latanoprost 0.005 % ophthalmic solution   Commonly known as: XALATAN   Place 1 drop into both eyes at bedtime.      sulfamethoxazole-trimethoprim 800-160 MG per tablet   Commonly known as: BACTRIM DS,SEPTRA DS   Take 1 tablet by mouth 2 (two) times daily.           Follow-up Information    Please follow up. (snf MD)           The results of significant  diagnostics from this hospitalization (including imaging, microbiology, ancillary and laboratory) are listed below for reference.    Significant Diagnostic Studies: Dg Foot Complete Right  21-Jan-2012  *RADIOLOGY REPORT*  Clinical Data: Diabetic ulceration of the heel  RIGHT FOOT COMPLETE - 3+ VIEW  Comparison: 11/30/2011  Findings: Again the bones are osteopenic.  There is extensive regional vascular calcification.  There are ordinary degenerative changes at the metatarsal phalangeal joint of the great toe.  Soft tissue ulceration is noted at the heel, but there is no evidence of osteomyelitis by plain radiography.  IMPRESSION: Large healed soft tissue ulceration.  No evidence of underlying osteomyelitis by plain radiography.   Original Report Authenticated By: Thomasenia Sales, M.D.     Microbiology: Recent Results (from the past 240 hour(s))  CULTURE, BLOOD (ROUTINE X 2)     Status: Normal (Preliminary result)   Collection Time   2012-01-21 12:30 PM      Component Value Range Status Comment   Specimen Description BLOOD RIGHT ANTECUBITAL   Final    Special Requests BOTTLES DRAWN AEROBIC AND ANAEROBIC 10CC   Final    Culture  Setup Time 2012-01-21 17:40   Final    Culture     Final    Value:        BLOOD CULTURE RECEIVED NO GROWTH TO DATE CULTURE WILL BE HELD FOR 5 DAYS BEFORE ISSUING A FINAL NEGATIVE REPORT   Report Status PENDING   Incomplete   CULTURE, BLOOD (ROUTINE X 2)     Status: Normal (Preliminary result)   Collection Time   2012/01/21 12:37 PM      Component Value Range Status Comment   Specimen Description BLOOD RIGHT ANTECUBITAL   Final    Special Requests BOTTLES DRAWN AEROBIC ONLY 10CC   Final    Culture  Setup Time 2012-01-21 17:41   Final    Culture     Final    Value:        BLOOD CULTURE RECEIVED NO GROWTH TO DATE CULTURE WILL BE HELD FOR 5 DAYS BEFORE ISSUING A FINAL NEGATIVE REPORT   Report Status PENDING   Incomplete      Labs: Basic Metabolic Panel:  Lab 01/07/12  0547 2012-01-21 0959  NA 137 132*  K 4.7 4.4  CL 104 98  CO2 27 27  GLUCOSE 79 387*  BUN 23 28*  CREATININE 1.05 1.20  CALCIUM 9.2 8.9  MG -- --  PHOS -- --   Liver Function Tests: No results found for this basename: AST:5,ALT:5,ALKPHOS:5,BILITOT:5,PROT:5,ALBUMIN:5  in the last 168 hours No results found for this basename: LIPASE:5,AMYLASE:5 in the last 168 hours No results found for this basename: AMMONIA:5 in the last 168 hours CBC:  Lab 01/08/12 0635 01/07/12 0547 01/06/12 0959  WBC 9.1 13.4* 12.7*  NEUTROABS -- -- 9.7*  HGB 9.6* 10.3* 10.1*  HCT 29.3* 31.8* 30.7*  MCV 88.5 87.6 90.3  PLT 134* 147* 122*   Cardiac Enzymes: No results found for this basename: CKTOTAL:5,CKMB:5,CKMBINDEX:5,TROPONINI:5 in the last 168 hours BNP: BNP (last 3 results) No results found for this basename: PROBNP:3 in the last 8760 hours CBG:  Lab 01/09/12 0728 01/08/12 2054 01/08/12 1611 01/08/12 1145 01/08/12 0736  GLUCAP 118* 206* 241* 157* 223*       Signed:  Phillipa Morden  [facility] 01/09/2012, 9:47 AM

## 2012-01-11 ENCOUNTER — Encounter: Payer: Self-pay | Admitting: *Deleted

## 2012-01-12 LAB — CULTURE, BLOOD (ROUTINE X 2): Culture: NO GROWTH

## 2012-01-18 ENCOUNTER — Encounter: Payer: Self-pay | Admitting: Internal Medicine

## 2012-01-18 ENCOUNTER — Ambulatory Visit (INDEPENDENT_AMBULATORY_CARE_PROVIDER_SITE_OTHER): Payer: Medicare Other | Admitting: Internal Medicine

## 2012-01-18 VITALS — BP 110/56 | HR 75 | Ht 70.0 in | Wt 180.0 lb

## 2012-01-18 DIAGNOSIS — I2510 Atherosclerotic heart disease of native coronary artery without angina pectoris: Secondary | ICD-10-CM

## 2012-01-18 DIAGNOSIS — I10 Essential (primary) hypertension: Secondary | ICD-10-CM

## 2012-01-18 DIAGNOSIS — Z950 Presence of cardiac pacemaker: Secondary | ICD-10-CM

## 2012-01-18 LAB — PACEMAKER DEVICE OBSERVATION
AL AMPLITUDE: 1.9 mv
AL IMPEDENCE PM: <200 Ohm
AL THRESHOLD: 1.25 V
ATRIAL PACING PM: 89
BAMS-0001: 170 {beats}/min
BAMS-0003: 70 {beats}/min
BATTERY VOLTAGE: 2.69 V
DEVICE MODEL PM: 1358160
RV LEAD AMPLITUDE: 3.4 mv
RV LEAD THRESHOLD: 0.625 V
VENTRICULAR PACING PM: 96

## 2012-01-18 NOTE — Progress Notes (Signed)
HPI [name] returns today for followup. He is a pleasant 78 yo man with a h/o symptomatic bradycardia, s/p PPM, DM, CAD, s/p CABG, and HTN. He has done well except that he is appropriately frail for his 89 years. In the interim, he denies chest pain, sob, or syncope. He does have a propensity to fall but has not injured himself. His appetite is still good. Allergies  Allergen Reactions  . Aleve (Naproxen Sodium) Other (See Comments)    "makes me feel crummy; warm or something"     Current Outpatient Prescriptions  Medication Sig Dispense Refill  . aspirin 325 MG tablet Take 325 mg by mouth daily.        Marland Kitchen docusate sodium (COLACE) 100 MG capsule Take 100 mg by mouth 2 (two) times daily.      . dorzolamide-timolol (COSOPT) 22.3-6.8 MG/ML ophthalmic solution Place 1 drop into both eyes 2 (two) times daily. In to affected eye      . Glucosamine-Chondroitin (GLUCOSAMINE CHONDR COMPLEX PO) Take 1 capsule by mouth daily.      Marland Kitchen HUMULIN N 100 UNIT/ML injection as directed.      . insulin aspart (NOVOLOG FLEXPEN) 100 UNIT/ML injection Inject into the skin as directed.      . insulin glargine (LANTUS) 100 UNIT/ML injection Inject 20 Units into the skin at bedtime.  10 mL    . latanoprost (XALATAN) 0.005 % ophthalmic solution Place 1 drop into both eyes at bedtime.        . Multiple Vitamin (MULTIVITAMIN) tablet Take 1 tablet by mouth daily.      . polyethylene glycol (MIRALAX / GLYCOLAX) packet Take 17 g by mouth daily.         Past Medical History  Diagnosis Date  . HTN (hypertension)   . Atherosclerotic heart disease   . Dyslipidemia   . Glaucoma(365)   . Blood transfusion   . Pacemaker 1992  . Frequent falls   . Foot ulcer, right 01/06/2012  . Cellulitis of foot, right 01/06/2012  . Anginal pain 1992  . Type II diabetes mellitus   . Diabetic peripheral neuropathy   . Blind left eye     ROS:   All systems reviewed and negative except as noted in the HPI.   Past Surgical  History  Procedure Date  . US echocardiography 07/08/2009  . Coronary artery bypass graft 1992    CAGG X6  . Appendectomy     "years ago" (01/06/2012)  . Total knee arthroplasty     "put a metal joint in my right knee"  . Tonsillectomy     "think so" (01/06/2012)  . Cataract extraction w/ intraocular lens  implant, bilateral   . Eye surgery ~ 2003    "put ring in my left eye; it's been there ~ 10 yr" (01/06/2012)  . Insert / replace / remove pacemaker 1999  . Cardiac catheterization      No family history on file.   History   Social History  . Marital Status: Widowed    Spouse Name: N/A    Number of Children: N/A  . Years of Education: N/A   Occupational History  . Not on file.   Social History Main Topics  . Smoking status: Never Smoker   . Smokeless tobacco: Never Used  . Alcohol Use: No     Comment: denies  . Drug Use: No     Comment: denies  . Sexually Active: No   Other Topics Concern  .  Not on file   Social History Narrative  . No narrative on file     BP 110/56  Pulse 75  Ht 5\' 10"  (1.778 m)  Wt 180 lb (81.647 kg)  BMI 25.83 kg/m2  SpO2 98%  Physical Exam:  Elderly appearing 78 yo man, NAD HEENT: Unremarkable Neck:  No JVD, no thyromegally Lungs:  Clear with no wheezes, rales, or rhonchi HEART:  Regular rate rhythm, no murmurs, no rubs, no clicks Abd:  soft, positive bowel sounds, no organomegally, no rebound, no guarding Ext:  2 plus pulses, no edema, no cyanosis, no clubbing Skin:  No rashes no nodules Neuro:  CN II through XII intact, motor grossly intact  DEVICE  Normal device function.  See PaceArt for details.   Assess/Plan:

## 2012-01-18 NOTE — Assessment & Plan Note (Signed)
He denies anginal symptoms. Continue current medical therapy. 

## 2012-01-18 NOTE — Assessment & Plan Note (Signed)
His blood pressure is well controlled. Will follow. 

## 2012-01-18 NOTE — Patient Instructions (Signed)
Your physician recommends that you schedule a follow-up appointment in: 3 months with device clinic and 12 months with Dr Taylor  

## 2012-01-18 NOTE — Assessment & Plan Note (Signed)
His [employer] DDD PPM is working normally. Will recheck in several months. 

## 2012-01-23 ENCOUNTER — Encounter (HOSPITAL_BASED_OUTPATIENT_CLINIC_OR_DEPARTMENT_OTHER): Payer: Medicare Other | Attending: General Surgery

## 2012-01-23 DIAGNOSIS — E1169 Type 2 diabetes mellitus with other specified complication: Secondary | ICD-10-CM | POA: Insufficient documentation

## 2012-01-23 DIAGNOSIS — L8993 Pressure ulcer of unspecified site, stage 3: Secondary | ICD-10-CM | POA: Insufficient documentation

## 2012-01-23 DIAGNOSIS — L89609 Pressure ulcer of unspecified heel, unspecified stage: Secondary | ICD-10-CM | POA: Insufficient documentation

## 2012-02-20 ENCOUNTER — Encounter (HOSPITAL_BASED_OUTPATIENT_CLINIC_OR_DEPARTMENT_OTHER): Payer: Medicare Other

## 2012-03-21 ENCOUNTER — Encounter (HOSPITAL_BASED_OUTPATIENT_CLINIC_OR_DEPARTMENT_OTHER): Payer: Medicare Other | Attending: General Surgery

## 2012-03-21 DIAGNOSIS — E1169 Type 2 diabetes mellitus with other specified complication: Secondary | ICD-10-CM | POA: Insufficient documentation

## 2012-03-21 DIAGNOSIS — L97409 Non-pressure chronic ulcer of unspecified heel and midfoot with unspecified severity: Secondary | ICD-10-CM | POA: Insufficient documentation

## 2012-04-11 ENCOUNTER — Telehealth: Payer: Self-pay | Admitting: Internal Medicine

## 2012-04-11 NOTE — Telephone Encounter (Signed)
New Problem:    Patient's daughter called in wanting to let you know that her father passed on 04/07/2012.

## 2012-04-14 DEATH — deceased

## 2013-02-03 IMAGING — CR DG FOOT COMPLETE 3+V*R*
3 series · 3 of 3 positions shown · non-contrast
Comparison: 11/30/2011

CLINICAL DATA: Diabetic ulceration of the heel

RIGHT FOOT COMPLETE - 3+ VIEW

[x foot ap right]
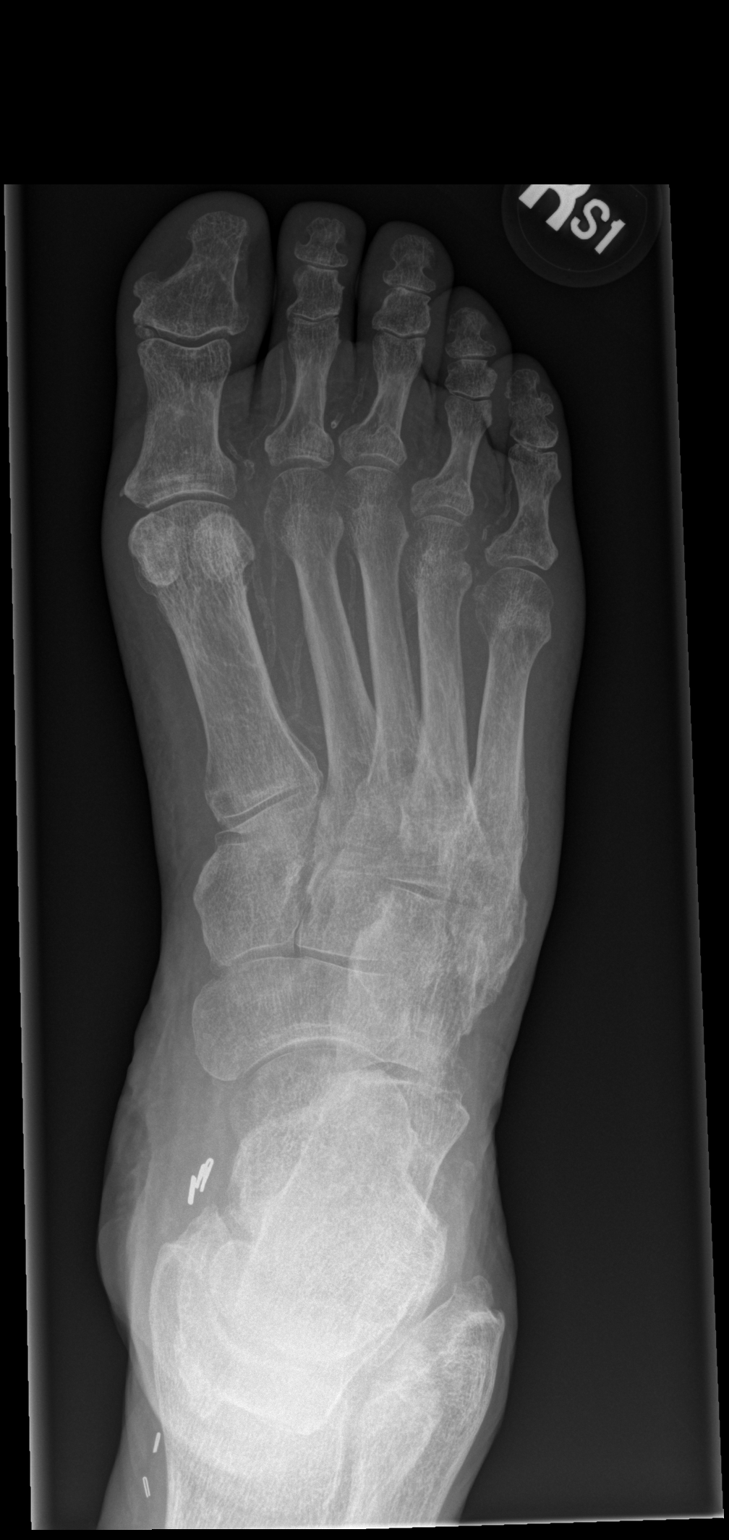

[x foot obl right]
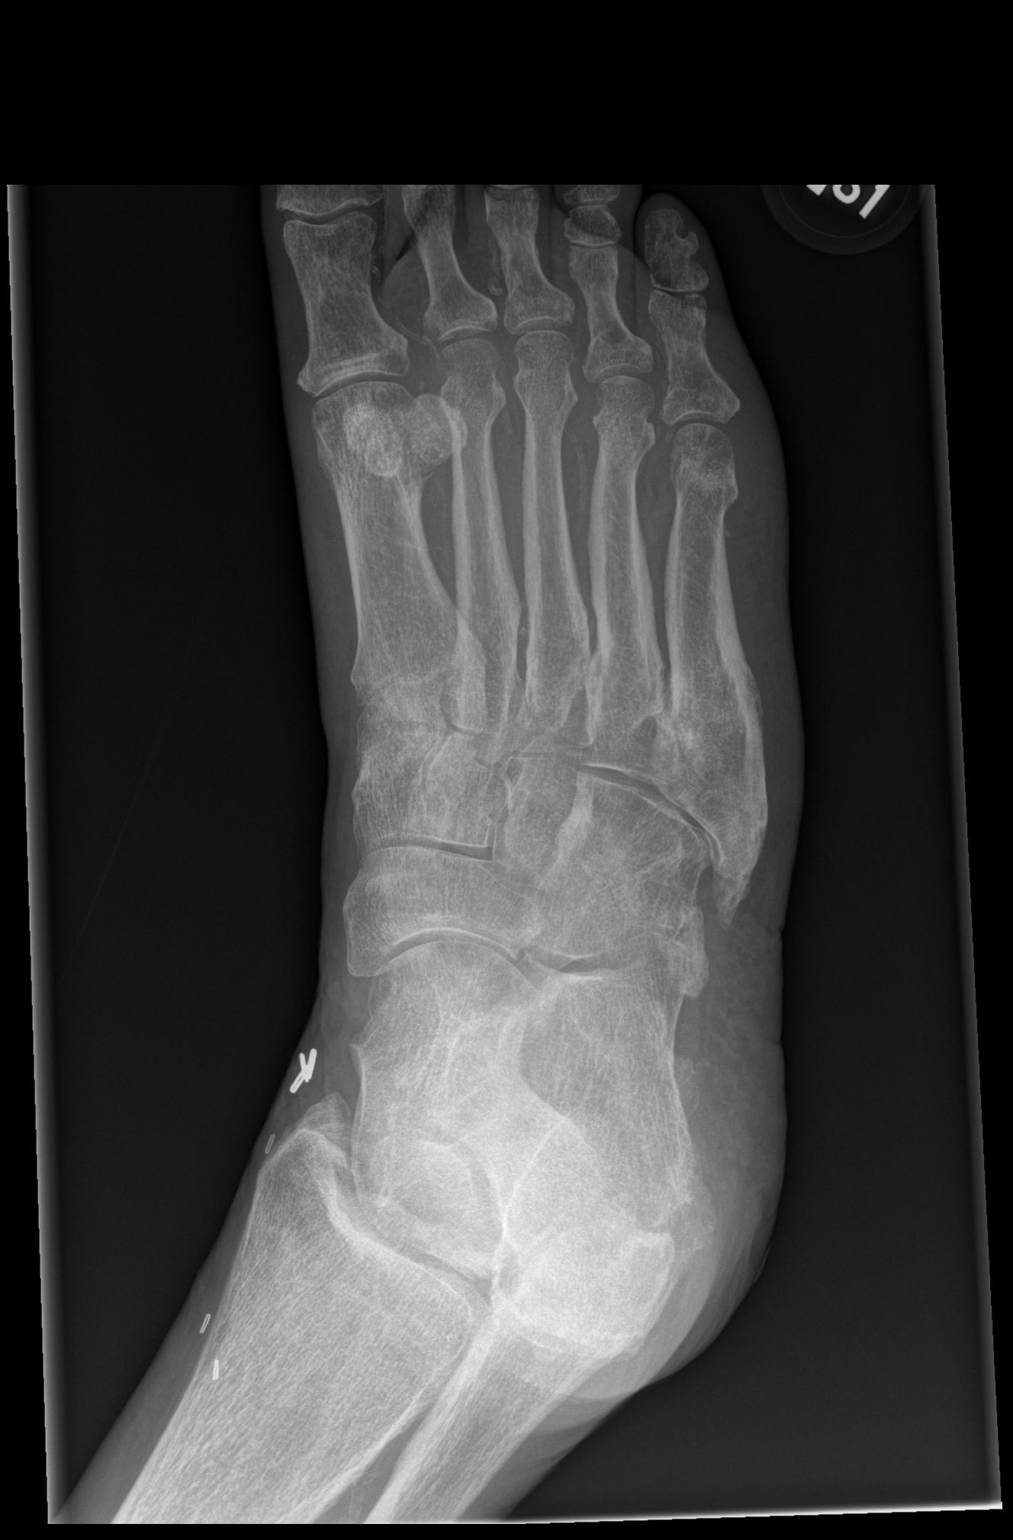

[x foot lat right]
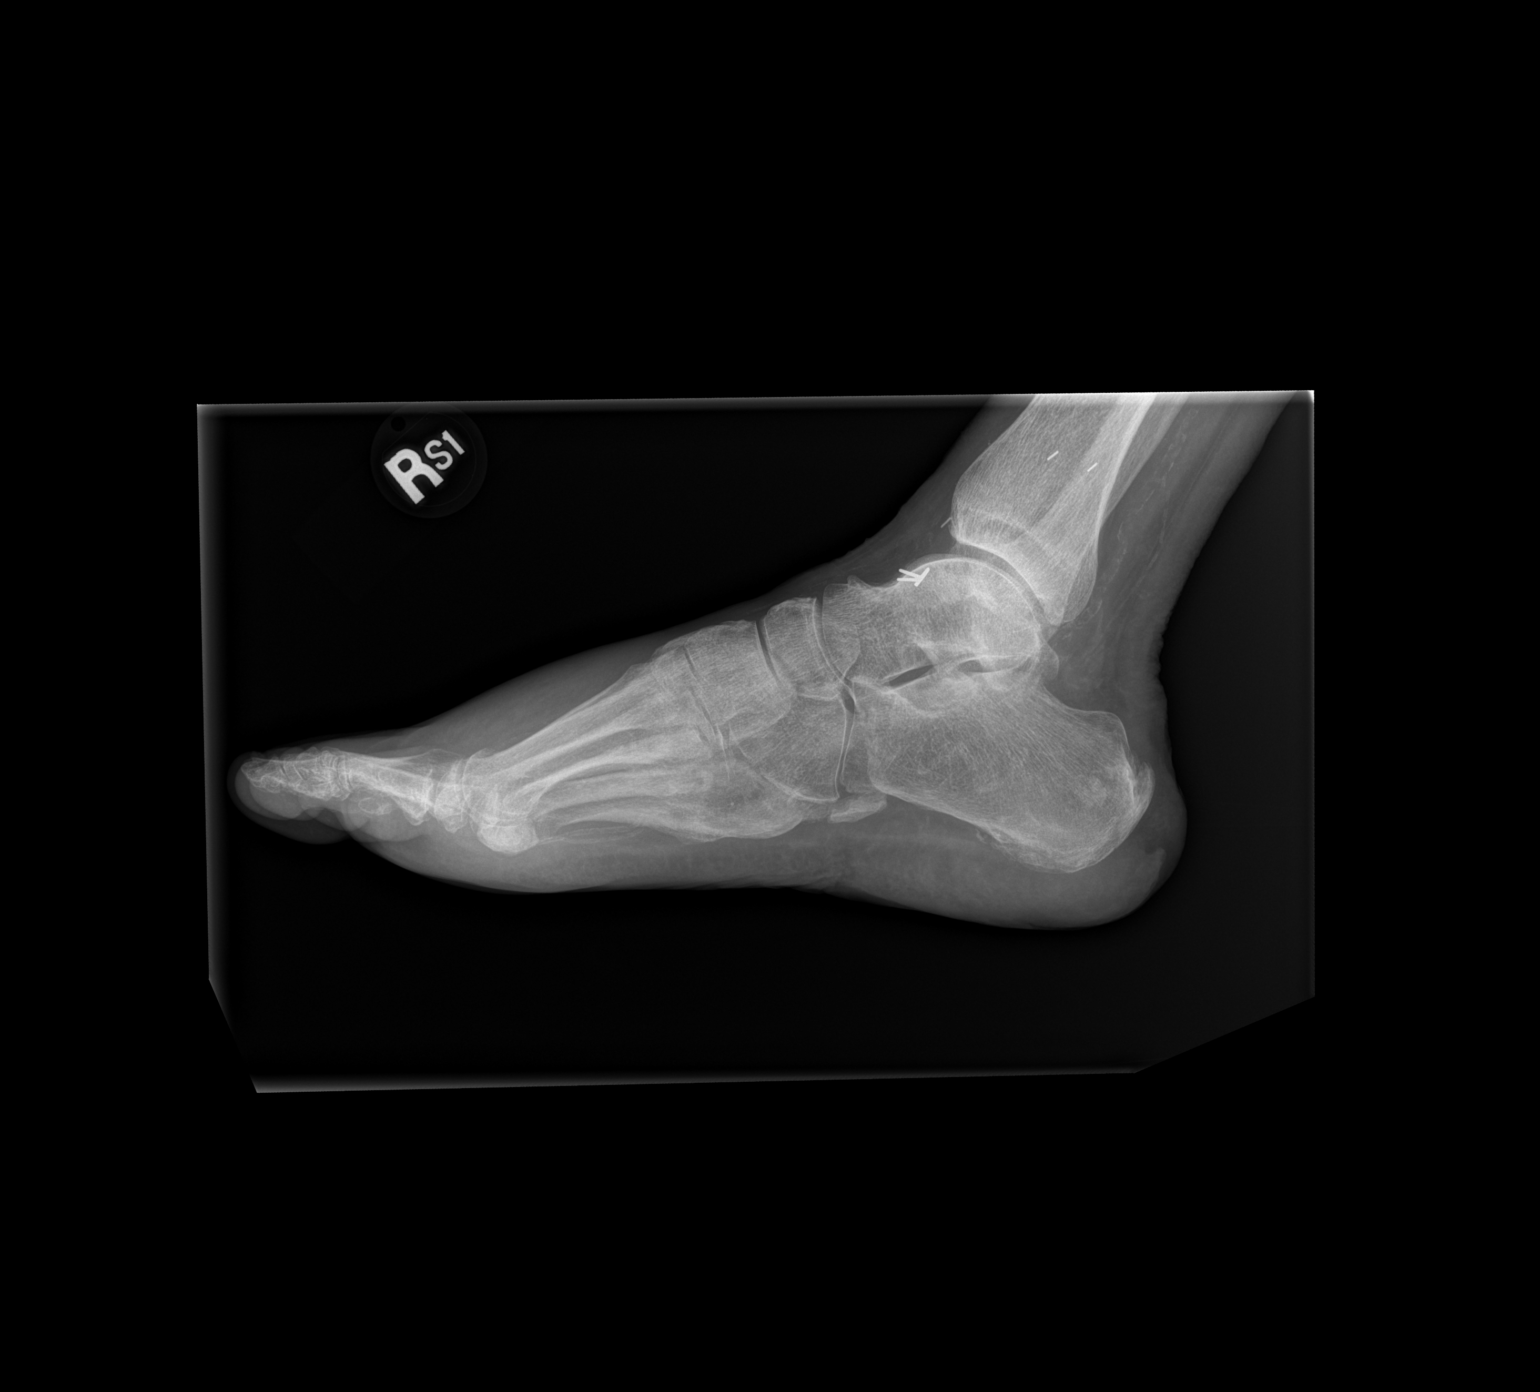

[3 of 3 positions shown; findings below may reference images not displayed]

FINDINGS: Again the bones are osteopenic.  There is extensive
regional vascular calcification.  There are ordinary degenerative
changes at the metatarsal phalangeal joint of the great toe.  Soft
tissue ulceration is noted at the heel, but there is no evidence of
osteomyelitis by plain radiography.
IMPRESSION: Large healed soft tissue ulceration.  No evidence of underlying
osteomyelitis by plain radiography.
